# Patient Record
Sex: Female | Born: 1980 | Race: White | Hispanic: No | Marital: Single | Smoking: Former smoker
Health system: Southern US, Community
[De-identification: ages and names within clinical notes are randomized; demographics above are authoritative.]

## PROBLEM LIST (undated history)

## (undated) DIAGNOSIS — B1920 Unspecified viral hepatitis C without hepatic coma: Secondary | ICD-10-CM

## (undated) DIAGNOSIS — F319 Bipolar disorder, unspecified: Secondary | ICD-10-CM

## (undated) DIAGNOSIS — J45909 Unspecified asthma, uncomplicated: Secondary | ICD-10-CM

## (undated) HISTORY — PX: CERVICAL BIOPSY  W/ LOOP ELECTRODE EXCISION: SUR135

## (undated) HISTORY — PX: TUBAL LIGATION: SHX77

## (undated) HISTORY — PX: DILATION AND CURETTAGE OF UTERUS: SHX78

---

## 2009-10-23 ENCOUNTER — Ambulatory Visit: Payer: Self-pay | Admitting: Psychiatry

## 2009-11-02 ENCOUNTER — Other Ambulatory Visit: Payer: Self-pay | Admitting: Emergency Medicine

## 2009-11-03 ENCOUNTER — Ambulatory Visit: Payer: Self-pay | Admitting: Psychiatry

## 2009-11-03 ENCOUNTER — Inpatient Hospital Stay (HOSPITAL_COMMUNITY): Admission: AD | Admit: 2009-11-03 | Discharge: 2009-11-10 | Payer: Self-pay | Admitting: Psychiatry

## 2010-06-02 LAB — BASIC METABOLIC PANEL
CO2: 24 mEq/L (ref 19–32)
Chloride: 107 mEq/L (ref 96–112)
Creatinine, Ser: 0.67 mg/dL (ref 0.4–1.2)
GFR calc Af Amer: >60 mL/min (ref >60)
Potassium: 3.6 mEq/L (ref 3.5–5.1)
Sodium: 138 mEq/L (ref 135–145)

## 2010-06-02 LAB — RAPID URINE DRUG SCREEN, HOSP PERFORMED
Amphetamines: NOT DETECTED
Barbiturates: NOT DETECTED
Benzodiazepines: POSITIVE — AB
Tetrahydrocannabinol: NOT DETECTED

## 2010-06-02 LAB — URINALYSIS, ROUTINE W REFLEX MICROSCOPIC
Glucose, UA: NEGATIVE mg/dL
Hgb urine dipstick: NEGATIVE
Ketones, ur: NEGATIVE mg/dL
Protein, ur: NEGATIVE mg/dL
Urobilinogen, UA: 0.2 mg/dL (ref 0.0–1.0)

## 2010-06-02 LAB — HEPATIC FUNCTION PANEL
ALT: 26 U/L (ref 0–35)
AST: 23 U/L (ref 0–37)
Albumin: 2.6 g/dL — ABNORMAL LOW (ref 3.5–5.2)
Alkaline Phosphatase: 87 U/L (ref 39–117)
Bilirubin, Direct: 0.1 mg/dL (ref 0.0–0.3)
Indirect Bilirubin: 0.3 mg/dL (ref 0.3–0.9)
Total Bilirubin: 0.4 mg/dL (ref 0.3–1.2)
Total Protein: 6.1 g/dL (ref 6.0–8.3)

## 2010-06-02 LAB — DIFFERENTIAL
Eosinophils Relative: 5 % (ref 0–5)
Lymphocytes Relative: 36 % (ref 12–46)
Lymphs Abs: 2.9 10*3/uL (ref 0.7–4.0)
Monocytes Relative: 6 % (ref 3–12)

## 2010-06-02 LAB — CBC
HCT: 41.1 % (ref 36.0–46.0)
Hemoglobin: 14.2 g/dL (ref 12.0–15.0)
MCH: 30.4 pg (ref 26.0–34.0)
MCV: 88.2 fL (ref 78.0–100.0)
Platelets: 280 10*3/uL (ref 150–400)
RBC: 4.66 MIL/uL (ref 3.87–5.11)
WBC: 8.1 10*3/uL (ref 4.0–10.5)

## 2010-06-02 LAB — SYPHILIS: RPR W/REFLEX TO RPR TITER AND TREPONEMAL ANTIBODIES, TRADITIONAL SCREENING AND DIAGNOSIS ALGORITHM: RPR Ser Ql: NONREACTIVE

## 2010-06-02 LAB — URINE MICROSCOPIC-ADD ON

## 2010-06-02 LAB — ETHANOL: Alcohol, Ethyl (B): <5 mg/dL (ref 0–10)

## 2010-06-02 LAB — GC/CHLAMYDIA PROBE AMP, URINE
Chlamydia, Swab/Urine, PCR: NEGATIVE
GC Probe Amp, Urine: NEGATIVE

## 2010-06-02 LAB — PREGNANCY, URINE: Preg Test, Ur: POSITIVE

## 2010-06-02 LAB — TSH: TSH: 0.667 u[IU]/mL (ref 0.350–4.500)

## 2010-06-02 LAB — HIV ANTIBODY (ROUTINE TESTING W REFLEX): HIV: NONREACTIVE

## 2014-11-23 ENCOUNTER — Other Ambulatory Visit (HOSPITAL_COMMUNITY): Payer: Self-pay | Admitting: *Deleted

## 2014-11-23 DIAGNOSIS — N6310 Unspecified lump in the right breast, unspecified quadrant: Secondary | ICD-10-CM

## 2014-11-25 ENCOUNTER — Telehealth: Payer: Self-pay | Admitting: Lab

## 2014-11-25 ENCOUNTER — Encounter (HOSPITAL_COMMUNITY): Payer: Self-pay

## 2014-11-25 ENCOUNTER — Ambulatory Visit (HOSPITAL_COMMUNITY)
Admission: RE | Admit: 2014-11-25 | Discharge: 2014-11-25 | Disposition: A | Discharge status: Home / Self Care | Payer: Self-pay | Source: Ambulatory Visit | Attending: Obstetrics and Gynecology | Admitting: Obstetrics and Gynecology

## 2014-11-25 VITALS — BP 116/70 | Temp 98.2°F | Ht 70.0 in | Wt 225.0 lb

## 2014-11-25 DIAGNOSIS — R922 Inconclusive mammogram: Secondary | ICD-10-CM

## 2014-11-25 HISTORY — DX: Bipolar disorder, unspecified: F31.9

## 2014-11-25 HISTORY — DX: Unspecified asthma, uncomplicated: J45.909

## 2014-11-25 HISTORY — DX: Unspecified viral hepatitis C without hepatic coma: B19.20

## 2014-11-25 NOTE — Progress Notes (Signed)
CLINIC:   [facility] [employer]) Clinic  REASON FOR VISIT: Well-woman exam   HISTORY OF PRESENT ILLNESS:   Ms. Cadogan is a 34 y.o. female who presents to the [facility] today for clinical breast exam. No previous mammogram. Her last pap smear was performed in February 2016 and was negative. Had an abnormal pap in 2005 with LEEP in 2006. Reports three negative paps since this procedure.   REVIEW OF SYSTEMS:   Patient reports lump in right breast that is increasing in size over the past several months. Reports white discharge from both nipples with stimulation. Denies breast pain, skin changes, nipple inversion bilaterally.  Denies any pelvic pain, pressure, or abnormal vaginal bleeding.   ALLERGIES: Allergies  Allergen Reactions  . Latex     MEDICATIONS:  Current outpatient prescriptions:  .  albuterol (PROVENTIL HFA;VENTOLIN HFA) 108 (90 BASE) MCG/ACT inhaler, Inhale into the lungs every 6 (six) hours as needed for wheezing or shortness of breath., Disp: , Rfl:  .  citalopram (CELEXA) 40 MG tablet, Take 40 mg by mouth daily., Disp: , Rfl:  .  ibuprofen (ADVIL,MOTRIN) 800 MG tablet, Take 800 mg by mouth every 8 (eight) hours as needed., Disp: , Rfl:  .  lithium 600 MG capsule, Take 600 mg by mouth 3 (three) times daily with meals.  once a day, Disp: , Rfl:   PHYSICAL EXAM:   BP 116/70 mmHg  Temp(Src) 98.2 F (36.8 C) (Oral)  Ht  (1.778 m)  Wt 225 lb (102.059 kg)  BMI 32.28 kg/m2  LMP 11/24/2014 (Exact Date)  General: Well-nourished, well-appearing female in no acute distress.  She is accompanied by a friend in clinic today.  Stoney Bang, LPN was present during physical exam for this patient.   Breasts: Bilateral breasts exposed and observed with patient standing (arms at side, arms on hips, arms on hips flexed forward, and arms over head).  No gross abnormalities including breast skin puckering or dimpling noted on observation.  Breasts  symmetrical without evidence of skin redness, thickening, or peau d'orange appearance. No nipple retraction or nipple discharge noted bilaterally.  No breast nodularity palpated in the left breast. Right breast with nodularity at the 4 o'clock position. No discrete masses palpated. Axillary lymph nodes: No axillary lymphadenopathy bilaterally.     ASSESSMENT & PLAN:  1. Breast cancer screening: Ms. Trevizo has scattered dense tissue on exam with no palpable breast abnormalities on her clinical breast exam today.  She will receive her diagnostic mammogram as scheduled.  She will be contacted by the imaging center for results of the mammogram. She was given instructions and educational materials regarding breast self-awareness. Ms. Kackley is aware of this plan and agrees with it.   2. Cervical cancer screening: Ms. Lomeli had a normal pap in February 2016. Repeat pap not indicated at this time.     Ms. Bisesi was encouraged to ask questions and all questions were answered to her satisfaction.      Clenton Pare, DNP, AGPCNP-BC, [facility] [phone]

## 2014-11-25 NOTE — Progress Notes (Signed)
Patient ID: Sherry Mcdonald, female   DOB: 1981/01/09, 34 y.o.   MRN: [id number] Smoking cessation information given to patient. Quitline information given to patient along with smoking cessation classes to the cancer center.

## 2014-11-25 NOTE — Telephone Encounter (Signed)
10/18/14-Zoe who is the contact person for this patient stated that patient still doesn't have orange card-Needs some additional paperwork.  She said that she would call me when this was completed. 09/07-Made follow up phone call-Zoe states that a document from IRS is needed to complete orange card process and to give her 2 wks to complete.

## 2014-11-30 ENCOUNTER — Ambulatory Visit
Admission: RE | Admit: 2014-11-30 | Discharge: 2014-11-30 | Disposition: A | Discharge status: Home / Self Care | Payer: No Typology Code available for payment source | Source: Ambulatory Visit | Attending: Obstetrics and Gynecology | Admitting: Obstetrics and Gynecology

## 2014-11-30 DIAGNOSIS — N6310 Unspecified lump in the right breast, unspecified quadrant: Secondary | ICD-10-CM

## 2015-01-26 ENCOUNTER — Other Ambulatory Visit: Payer: Self-pay

## 2015-01-27 NOTE — Telephone Encounter (Signed)
Pt now has orange card-was scheduled for labs on 11/9-no show.  Patient  got lost-Zoe didnt bring -someone else did. Rescheduled for 01/31/2015-Zoe is bringing pt.  She will receive an appmt to see Dr Luciana Axeomer the day she comes in for labs.

## 2015-01-31 ENCOUNTER — Other Ambulatory Visit: Payer: Self-pay

## 2015-01-31 DIAGNOSIS — B182 Chronic viral hepatitis C: Secondary | ICD-10-CM

## 2015-01-31 LAB — HIV ANTIBODY (ROUTINE TESTING W REFLEX): HIV: NONREACTIVE

## 2015-01-31 LAB — HEPATITIS A ANTIBODY, TOTAL: Hep A Total Ab: REACTIVE — AB

## 2015-01-31 LAB — HEPATITIS B CORE ANTIBODY, TOTAL: HEP B C TOTAL AB: NONREACTIVE

## 2015-01-31 LAB — HEPATITIS B SURFACE ANTIGEN: Hepatitis B Surface Ag: NEGATIVE

## 2015-01-31 LAB — HEPATITIS B SURFACE ANTIBODY,QUALITATIVE: Hep B S Ab: NEGATIVE

## 2015-01-31 LAB — IRON: IRON: 65 ug/dL (ref 40–190)

## 2015-02-01 LAB — ANTI-NUCLEAR AB-TITER (ANA TITER)

## 2015-02-01 LAB — PROTIME-INR
INR: 0.89 (ref <1.50)
PROTHROMBIN TIME: 12.1 s (ref 11.6–15.2)

## 2015-02-01 LAB — ANA: ANA: POSITIVE — AB

## 2015-02-01 NOTE — Telephone Encounter (Signed)
Pt and Zoey came in for blood work 01/31/15 and was given an appmt to see Dr. Luciana Axeomer on 02/16/2015 at 325 pm

## 2015-02-02 NOTE — Addendum Note (Signed)
Encounter addended by: Priscille Heidelberghristine P Esa Raden, RN on: 02/02/2015  4:39 PM<BR>     Documentation filed: Charges VN

## 2015-02-06 LAB — HEPATITIS C RNA QUANTITATIVE
HCV QUANT LOG: 6.12 {Log} — AB (ref <1.18)
HCV Quantitative: 1321038 IU/mL — ABNORMAL HIGH (ref <15)

## 2015-02-08 LAB — HEPATITIS C GENOTYPE

## 2015-02-16 ENCOUNTER — Encounter: Payer: Self-pay | Admitting: Internal Medicine

## 2015-02-16 ENCOUNTER — Ambulatory Visit (INDEPENDENT_AMBULATORY_CARE_PROVIDER_SITE_OTHER): Payer: No Typology Code available for payment source | Admitting: Internal Medicine

## 2015-02-16 VITALS — BP 126/82 | HR 79 | Temp 98.5°F | Ht 70.0 in | Wt 223.0 lb

## 2015-02-16 DIAGNOSIS — B182 Chronic viral hepatitis C: Secondary | ICD-10-CM | POA: Insufficient documentation

## 2015-02-16 DIAGNOSIS — Z23 Encounter for immunization: Secondary | ICD-10-CM

## 2015-02-16 MED ORDER — ELBASVIR-GRAZOPREVIR 50-100 MG PO TABS: 1.0000 | ORAL_TABLET | Freq: Every day | ORAL | Status: DC

## 2015-02-16 NOTE — Addendum Note (Signed)
Addended by: Gardiner BarefootOMER, Fayola Meckes W on: 02/16/2015 04:25 PM   Modules accepted: Orders

## 2015-02-16 NOTE — Progress Notes (Signed)
[facility]   CC: consideration for treatment for chronic hepatitis C  HPI:  +Sherry Mcdonald is a 34 y.o. female who presents for initial evaluation and management of chronic hepatitis C.  Patient tested positive tested in 2007. 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 Patient denies multiple sexual partners, renal dialysis, sexual contact with person with liver disease. Patient has had other studies performed. Results: hepatitis C RNA by PCR, result: positive. Patient has not had prior treatment for Hepatitis C. Patient does not have a past history of liver disease. Patient does not have a family history of liver disease. Patient does not  have associated signs or symptoms related to liver disease.  Labs reviewed and confirm chronic hepatitis C with a positive viral load.    Has remained drug free.      Patient does have documented immunity to Hepatitis A. Patient does not have documented immunity to Hepatitis B.    Review of Systems:  Constitutional: negative for fatigue and malaise Gastrointestinal: negative for diarrhea and constipation Musculoskeletal: negative for myalgias and arthralgias All other systems reviewed and are negative      Past Medical History  Diagnosis Date  . Asthma   . Bipolar 1 disorder (HCC)   . Hepatitis C     Prior to Admission medications   Medication Sig Start Date End Date Taking? Authorizing Provider  albuterol (PROVENTIL HFA;VENTOLIN HFA) 108 (90 BASE) MCG/ACT inhaler Inhale into the lungs every 6 (six) hours as needed for wheezing or shortness of breath.   Yes Historical Provider, MD  citalopram (CELEXA) 40 MG tablet Take 40 mg by mouth daily.   Yes Historical Provider, MD  lithium 600 MG capsule Take 600 mg by mouth 3 (three) times daily with meals.  once a day   Yes Historical Provider, MD  meloxicam (MOBIC) 15 MG tablet Take 15 mg by mouth daily.   Yes Historical  Provider, MD    Allergies  Allergen Reactions  . Latex     Social History  Substance Use Topics  . Smoking status: Current Every Day Smoker    Types: Cigarettes    Start date: 03/19/2009  . Smokeless tobacco: Never Used  . Alcohol Use: No    Family History  Problem Relation Age of Onset  . Breast cancer Maternal Grandmother   . Breast cancer Paternal Grandmother   . Diabetes Paternal Grandmother   No liver cancer, no cirrhosis   Objective:  Constitutional: in no apparent distress and alert,  Filed Vitals:   02/16/15 1532  BP: 126/82  Pulse: 79  Temp: 98.5 F (36.9 C)   Eyes: anicteric Cardiovascular: Cor RRR and No murmurs Respiratory: CTA B; normal respiratory effort Gastrointestinal: Bowel sounds are normal, liver is not enlarged, spleen is not enlarged Musculoskeletal: peripheral pulses normal, no pedal edema, no clubbing or cyanosis Skin: negative for - jaundice, spider hemangioma, telangiectasia, palmar erythema, ecchymosis and atrophy; no porphyria cutanea tarda Lymphatic: no cervical lymphadenopathy   Laboratory Genotype:  Lab Results  Component Value Date   HCVGENOTYPE 1a 01/31/2015   HCV viral load:  Lab Results  Component Value Date   HCVQUANT [id number]* 01/31/2015   Lab Results  Component Value Date   WBC 8.1 11/02/2009   HGB 14.2 11/02/2009   HCT 41.1 11/02/2009   MCV 88.2 11/02/2009   PLT 280 11/02/2009    Lab Results  Component Value Date   CREATININE 0.67  11/02/2009   BUN 6 11/02/2009   NA 138 11/02/2009   K 3.6 11/02/2009   CL 107 11/02/2009   CO2 24 11/02/2009    Lab Results  Component Value Date   ALT 26 11/04/2009   AST 23 11/04/2009   ALKPHOS 87 11/04/2009     Labs and history reviewed and show CHILD-PUGH A  5-6 points: Child class A 7-9 points: Child class B 10-15 points: Child class C  Lab Results  Component Value Date   INR 0.89 01/31/2015   BILITOT 0.4 11/04/2009   ALBUMIN 2.6* 11/04/2009     Assessment:  New Patient with Chronic Hepatitis C genotype 1a, untreated.  I discussed with the patient the lab findings that confirm chronic hepatitis C as well as the natural history and progression of disease including about 30% of people who develop cirrhosis of the liver if left untreated and once cirrhosis is established there is a 2-7% risk per year of liver cancer and liver failure.  I discussed the importance of treatment and benefits in reducing the risk, even if significant liver fibrosis exists.   Plan: 1) Patient counseled extensively on limiting acetaminophen to no more than 2 grams daily, avoidance of alcohol. 2) Transmission discussed with patient including sexual transmission, sharing razors and toothbrush.   3) Will need referral to gastroenterology if concern for cirrhosis 4) Will need referral for substance abuse counseling: No.; Further work up to include urine drug screen  No. 5) Will prescribe Zepatier for 12 weeks or 16 weeks with ribavirin if any NS5A resistance found 6) Hepatitis A vaccine No. 7) Hepatitis B vaccine Yes.   8) Pneumovax vaccine if concern for cirrhosis 9) Further work up to include liver staging with elastography 10) NS5A test  Yes.   10) will follow up after elastography

## 2015-02-16 NOTE — Addendum Note (Signed)
Addended by: Wendall MolaOCKERHAM, Kaeleb Emond A on: 02/16/2015 04:54 PM   Modules accepted: Orders

## 2015-02-16 NOTE — Patient Instructions (Signed)
Date 02/16/2015  Dear Ms Valentina LucksStout, As discussed in the ID Clinic, your hepatitis C therapy will include the following medication:          ZEPATIER (elbasvir/grazoprevir)   Please note that the medications are for a total of 12 weeks or 16 weeks. ---------------------------------------------------------------- Your HCV Treatment Start Date: TBA   Your HCV genotype:  1a    Liver Fibrosis: TBD    ---------------------------------------------------------------- YOUR PHARMACY CONTACT:   [facility] [address] Phone: [phone] Hours: Monday to Friday 7:30 am to 6:00 pm   Please always contact your pharmacy at least 3-4 business days before you run out of medications to ensure your next month's medication is ready or 1 week prior to running out if you receive it by mail.  Remember, each prescription is for 28 days. ---------------------------------------------------------------- GENERAL NOTES REGARDING YOUR HEPATITIS C MEDICATION:  Zepatier (elbasvir/grazoprevir): -can be taken with or without food -The tablets are yellow -The tablets should be stored at room temperature   - The common side effects associated with Zepatier include:      1. Fatigue      2. Headache      3. Nausea      4. Diarrhea      5. Insomnia

## 2015-02-20 LAB — HCV RNA NS5A DRUG RESISTANCE

## 2015-02-21 ENCOUNTER — Telehealth: Payer: Self-pay | Admitting: *Deleted

## 2015-02-21 NOTE — Telephone Encounter (Signed)
Patient is asking if her lab results are in and if a determination has been made regarding her Hep C therapy.  She is to follow up with Dr. Luciana Axeomer 1/9, but was looking for results sooner. She is at a temporary number 12/5 and 12/6. Andree CossHowell, Erinn Mendosa M, RN

## 2015-02-21 NOTE — Telephone Encounter (Signed)
The NS5A test is still pending and once it is done, Sherry Mcdonald will send in the info to [facility].

## 2015-03-01 ENCOUNTER — Encounter: Payer: Self-pay | Admitting: Pharmacy Technician

## 2015-03-03 ENCOUNTER — Telehealth: Payer: Self-pay | Admitting: Pharmacist Clinician (PhC)/ Clinical Pharmacy Specialist

## 2015-03-03 NOTE — Telephone Encounter (Signed)
Sherry Mcdonald just started on zepatier a couple days ago. She is on zepatier without riba. She complained of just feeling tired and some diarrhea. I told that it could be due to early side effects with the drug and to get some OTC loperamide for PRN diarrhea. Stressed the importance of not missing any meds. She is going to try that and if still has diarrhea issue, we can do lomotil if needed.

## 2015-03-17 ENCOUNTER — Ambulatory Visit (HOSPITAL_COMMUNITY): Admission: RE | Admit: 2015-03-17 | Payer: No Typology Code available for payment source | Source: Ambulatory Visit

## 2015-03-22 ENCOUNTER — Telehealth: Payer: Self-pay | Admitting: *Deleted

## 2015-03-22 NOTE — Telephone Encounter (Signed)
Ok, if she doesn't feel she can take it we can try something else later, maybe have her come back in 4-6 months and can see then if some resistance developed. thanks

## 2015-03-22 NOTE — Telephone Encounter (Signed)
No, not really.  Minh?

## 2015-03-22 NOTE — Telephone Encounter (Signed)
Patient feels the Zepatier is "wiping out her bipolar medications" and cannot function. She is experiencing a depressive episode. She has had uncontrolled episodes of sobbing for the last 2.5 weeks, has had suicidal thoughts.  She is taking Lithium, celexa and is scheduled for [facility], appointment 1/[facility], for adjustment of her mental health medication. She missed her elastography due to this depression.  She has stopped the zepatier 1/1, is aware of the consequences, that this is a one-time only shot.  The suicidal thoughts and sobbing has alleviated since stopping the zepatier.  She is not sure if she wants to restart Zepatier after her mental health meds are adjusted, does not even know if it is an option.  [facility], is staying with a friend Pam temporarily - awaiting address approval from her officer.  [place].  Per patient, it is ok to call her at her friend's phone numbers.  Andree CossHowell, Tarry Fountain M, RN

## 2015-03-22 NOTE — Telephone Encounter (Signed)
Just run the interaction check again. No issue AT ALL with celexa or lithium with zepatier. I think she called me before too.

## 2015-03-24 NOTE — Telephone Encounter (Signed)
Patient had questions about restarting her Zepatier, asking if she should call for a refill when she has 10 left.  RN explained that she should NOT do this, as she has already stopped the medication (per her previous report, she stopped it 1/1). Patient stated that she "thought about restarting it today because when she took it yesterday things seemed ok." RN again advised against this, as per Dr. Ephriam Knucklesomer's instructions this was putting her at risk for resistance.  RN cancelled patient's appointment 1/9, confirmed return appointment 6/28. RN offered to have patient drop off what is left [facility] so we could dispose of it, rather than her choosing to restart treatment on her own.  Patient will consider this.

## 2015-03-24 NOTE — Telephone Encounter (Signed)
Scheduled her 6/28.

## 2015-03-27 ENCOUNTER — Emergency Department (HOSPITAL_BASED_OUTPATIENT_CLINIC_OR_DEPARTMENT_OTHER): Payer: No Typology Code available for payment source

## 2015-03-27 ENCOUNTER — Encounter (HOSPITAL_BASED_OUTPATIENT_CLINIC_OR_DEPARTMENT_OTHER): Payer: Self-pay | Admitting: Emergency Medicine

## 2015-03-27 ENCOUNTER — Emergency Department (HOSPITAL_BASED_OUTPATIENT_CLINIC_OR_DEPARTMENT_OTHER)
Admission: EM | Admit: 2015-03-27 | Discharge: 2015-03-27 | Disposition: A | Discharge status: Home / Self Care | Payer: No Typology Code available for payment source | Attending: Emergency Medicine | Admitting: Emergency Medicine

## 2015-03-27 DIAGNOSIS — Z79899 Other long term (current) drug therapy: Secondary | ICD-10-CM | POA: Insufficient documentation

## 2015-03-27 DIAGNOSIS — J45901 Unspecified asthma with (acute) exacerbation: Secondary | ICD-10-CM

## 2015-03-27 DIAGNOSIS — F17210 Nicotine dependence, cigarettes, uncomplicated: Secondary | ICD-10-CM | POA: Insufficient documentation

## 2015-03-27 DIAGNOSIS — M7711 Lateral epicondylitis, right elbow: Secondary | ICD-10-CM

## 2015-03-27 DIAGNOSIS — Z8619 Personal history of other infectious and parasitic diseases: Secondary | ICD-10-CM | POA: Insufficient documentation

## 2015-03-27 DIAGNOSIS — F319 Bipolar disorder, unspecified: Secondary | ICD-10-CM | POA: Insufficient documentation

## 2015-03-27 DIAGNOSIS — Z91040 Latex allergy status: Secondary | ICD-10-CM | POA: Insufficient documentation

## 2015-03-27 MED ORDER — PREDNISONE 20 MG PO TABS
40.0000 mg | ORAL_TABLET | Freq: Once | ORAL | Status: AC
  Administered 2015-03-27: 40 mg via ORAL
  Filled 2015-03-27: qty 2

## 2015-03-27 MED ORDER — PREDNISONE 20 MG PO TABS: ORAL_TABLET | ORAL | Status: DC

## 2015-03-27 MED ORDER — OXYCODONE HCL 5 MG PO TABS
5.0000 mg | ORAL_TABLET | Freq: Once | ORAL | Status: AC
  Administered 2015-03-27: 5 mg via ORAL
  Filled 2015-03-27: qty 1

## 2015-03-27 MED ORDER — ALBUTEROL SULFATE (2.5 MG/3ML) 0.083% IN NEBU
INHALATION_SOLUTION | RESPIRATORY_TRACT | Status: AC
  Administered 2015-03-27: 2.5 mg
  Filled 2015-03-27: qty 3

## 2015-03-27 MED ORDER — AEROCHAMBER PLUS W/MASK MISC
1.0000 | Freq: Once | Status: DC
  Filled 2015-03-27: qty 1

## 2015-03-27 MED ORDER — IPRATROPIUM-ALBUTEROL 0.5-2.5 (3) MG/3ML IN SOLN
RESPIRATORY_TRACT | Status: AC
  Administered 2015-03-27: 3 mL
  Filled 2015-03-27: qty 3

## 2015-03-27 MED ORDER — ALBUTEROL SULFATE HFA 108 (90 BASE) MCG/ACT IN AERS
2.0000 | INHALATION_SPRAY | Freq: Once | RESPIRATORY_TRACT | Status: AC
  Administered 2015-03-27: 2 via RESPIRATORY_TRACT
  Filled 2015-03-27: qty 6.7

## 2015-03-27 NOTE — ED Notes (Signed)
Per pt report is out of inhaler for 3 weeks. Next complaint is that last week was at work moving furniture when felt sharp pain to rt elbow that has numbness sharp pain at times in last two finger of rt hand. Today assisted roommate with car in ditch and increase pain to rt elbow. Took tylenol for pain with no relief.

## 2015-03-27 NOTE — ED Provider Notes (Signed)
CSN: [id number]     Arrival date & time 03/27/15  1441 History   First MD Initiated Contact with Patient 03/27/15 1712     Chief Complaint  Patient presents with  . Asthma  . Arm Pain     (Consider location/radiation/quality/duration/timing/severity/associated sxs/prior Treatment) Patient is a 35 y.o. female presenting with asthma and arm pain.  Asthma This is a recurrent problem. The current episode started more than 1 week ago. The problem occurs constantly. The problem has not changed since onset.Pertinent negatives include no chest pain, no headaches and no shortness of breath. Nothing aggravates the symptoms. Nothing relieves the symptoms. She has tried nothing for the symptoms. The treatment provided no relief.  Arm Pain This is a new problem. The current episode started more than 1 week ago. The problem occurs constantly. The problem has been gradually worsening. Pertinent negatives include no chest pain, no headaches and no shortness of breath. The symptoms are aggravated by twisting.    35 yo F with a chief complaints of right elbow pain. This is been going on for the past week or so. Worse with movement palpation. Localized to the lateral aspect of the elbow. Denies injury. Patient denies fevers or chills. Patient is also run out of her inhaler. Feels like her asthma has gotten mildly worse. States that she is unable take NSAIDs secondary to her lithium. Has been taking Tylenol without relief.  Past Medical History  Diagnosis Date  . Asthma   . Bipolar 1 disorder (HCC)   . Hepatitis C    Past Surgical History  Procedure Laterality Date  . Tubal ligation    . Cervical biopsy  w/ loop electrode excision    . Dilation and curettage of uterus     Family History  Problem Relation Age of Onset  . Breast cancer Maternal Grandmother   . Breast cancer Paternal Grandmother   . Diabetes Paternal Grandmother    Social History  Substance Use Topics  . Smoking status: Current Every  Day Smoker    Types: Cigarettes    Start date: 03/19/2009  . Smokeless tobacco: Never Used  . Alcohol Use: No   OB History    Gravida Para Term Preterm AB TAB SAB Ectopic Multiple Living   7 3 3  4  4   3      Review of Systems  Constitutional: Negative for fever and chills.  HENT: Negative for congestion and rhinorrhea.   Eyes: Negative for redness and visual disturbance.  Respiratory: Positive for wheezing. Negative for shortness of breath.   Cardiovascular: Negative for chest pain and palpitations.  Gastrointestinal: Negative for nausea and vomiting.  Genitourinary: Negative for dysuria and urgency.  Musculoskeletal: Positive for myalgias and arthralgias.  Skin: Negative for pallor and wound.  Neurological: Negative for dizziness and headaches.      Allergies  Latex  Home Medications   Prior to Admission medications   Medication Sig Start Date End Date Taking? Authorizing Provider  albuterol (PROVENTIL HFA;VENTOLIN HFA) 108 (90 BASE) MCG/ACT inhaler Inhale into the lungs every 6 (six) hours as needed for wheezing or shortness of breath.    Historical Provider, MD  citalopram (CELEXA) 40 MG tablet Take 40 mg by mouth daily.    Historical Provider, MD  Elbasvir-Grazoprevir (ZEPATIER) 50-100 MG TABS Take 1 tablet by mouth daily. 02/16/15   Gardiner Barefoot, MD  lithium 600 MG capsule Take 600 mg by mouth 3 (three) times daily with meals. 300mg  once a day  Historical Provider, MD  meloxicam (MOBIC) 15 MG tablet Take 15 mg by mouth daily.    Historical Provider, MD  predniSONE (DELTASONE) 20 MG tablet 2 tabs po daily x 4 days 03/27/15   Melene Planan Korben Carcione, DO   BP 127/81 mmHg  Pulse 76  Temp(Src) 98.1 F (36.7 C) (Oral)  Resp 18  Ht 5\' 10"  (1.778 m)  Wt 221 lb (100.245 kg)  BMI 31.71 kg/m2  SpO2 100%  LMP 03/04/2015 (Within Weeks) Physical Exam  Constitutional: She is oriented to person, place, and time. She appears well-developed and well-nourished. No distress.  HENT:  Head:  Normocephalic and atraumatic.  Eyes: EOM are normal. Pupils are equal, round, and reactive to light.  Neck: Normal range of motion. Neck supple.  Cardiovascular: Normal rate and regular rhythm.  Exam reveals no gallop and no friction rub.   No murmur heard. Pulmonary/Chest: Effort normal. She has no wheezes. She has no rales.  Abdominal: Soft. She exhibits no distension. There is no tenderness.  Musculoskeletal: She exhibits tenderness. She exhibits no edema.  Tender palpation to the lateral epicondyle of the right elbow. Pain with internal and external rotation. Pulse motor and sensation intact distally.  Neurological: She is alert and oriented to person, place, and time.  Skin: Skin is warm and dry. She is not diaphoretic.  Psychiatric: She has a normal mood and affect. Her behavior is normal.  Nursing note and vitals reviewed.   ED Course  Procedures (including critical care time) Labs Review Labs Reviewed - No data to display  Imaging Review Dg Elbow Complete Right  03/27/2015  CLINICAL DATA:  Elbow pain for 1 and half weeks. EXAM: RIGHT ELBOW - COMPLETE 3+ VIEW COMPARISON:  None. FINDINGS: There is no evidence of fracture, dislocation, or joint effusion. There is no evidence of arthropathy or other focal bone abnormality. Soft tissues are unremarkable. IMPRESSION: Negative. Electronically Signed   By: Signa Kellaylor  Stroud M.D.   On: 03/27/2015 16:11   I have personally reviewed and evaluated these images and lab results as part of my medical decision-making.   EKG Interpretation None      MDM   Final diagnoses:  Tennis elbow syndrome, right  Asthma attack    35 yo F with a chief complaint of right elbow pain. Clinically with tennis elbow. Discussed risks and benefits of NSAIDs. Patient will discuss with her psychiatrist. Will start on steroids for her asthma flare. Given inhaler with spacer. PCP follow-up.   I have discussed the diagnosis/risks/treatment options with the patient  and believe the pt to be eligible for discharge home to follow-up with PCP. We also discussed returning to the ED immediately if new or worsening sx occur. We discussed the sx which are most concerning (e.g., sudden worsening pain, fever, inability to tolerate by mouth) that necessitate immediate return. Medications administered to the patient during their visit and any new prescriptions provided to the patient are listed below.  Medications given during this visit Medications  albuterol (PROVENTIL) (2.5 MG/3ML) 0.083% nebulizer solution (2.5 mg  Given 03/27/15 1504)  ipratropium-albuterol (DUONEB) 0.5-2.5 (3) MG/3ML nebulizer solution (3 mLs  Given 03/27/15 1504)  predniSONE (DELTASONE) tablet 40 mg (40 mg Oral Given 03/27/15 1939)  oxyCODONE (Oxy IR/ROXICODONE) immediate release tablet 5 mg (5 mg Oral Given 03/27/15 1939)  albuterol (PROVENTIL HFA;VENTOLIN HFA) 108 (90 Base) MCG/ACT inhaler 2 puff (2 puffs Inhalation Given 03/27/15 1944)    Discharge Medication List as of 03/27/2015  7:05 PM  START taking these medications   Details  predniSONE (DELTASONE) 20 MG tablet 2 tabs po daily x 4 days, Print        The patient appears reasonably screen and/or stabilized for discharge and I doubt any other medical condition or other [facility] requiring further screening, evaluation, or treatment in the ED at this time prior to discharge.      Melene Plan, DO 03/27/15 2343

## 2015-03-27 NOTE — Discharge Instructions (Signed)
Talk to your psychiatrist about NSIADS.  If able take 4 over the counter ibuprofen tablets 3 times a day or 2 over-the-counter naproxen tablets twice a day for pain.  Tennis Elbow Tennis elbow (lateral epicondylitis) is inflammation of the outer tendons of your forearm close to your elbow. Your tendons attach your muscles to your bones. The outer tendons of your forearm are used to extend your wrist, and they attach on the outside part of your elbow. Tennis elbow is often found in people who play tennis, but anyone may get the condition from repeatedly extending the wrist or turning the forearm. CAUSES This condition is caused by repeatedly extending your wrist and using your hands. It can result from sports or work that requires repetitive forearm movements. Tennis elbow may also be caused by an injury. RISK FACTORS You have a higher risk of developing tennis elbow if you play tennis or another racquet sport. You also have a higher risk if you frequently use your hands for work. This condition is also more likely to develop in:  Musicians.  Carpenters, painters, and plumbers.  Cooks.  Cashiers.  People who work in [employer].  Holiday representativeConstruction workers.  Butchers.  People who use computers. SYMPTOMS Symptoms of this condition include:  Pain and tenderness in your forearm and the outer part of your elbow. You may only feel the pain when you use your arm, or you may feel it even when you are not using your arm.  A burning feeling that runs from your elbow through your arm.  Weak grip in your hands. DIAGNOSIS  This condition may be diagnosed by medical history and physical exam. You may also have other tests, including:  X-rays.  MRI. TREATMENT Your health care provider will recommend lifestyle adjustments, such as resting and icing your arm. Treatment may also include:  Medicines for inflammation. This may include shots of cortisone if your pain continues.  Physical therapy. This  may include massage or exercises.  An elbow brace. Surgery may eventually be recommended if your pain does not go away with treatment. HOME CARE INSTRUCTIONS Activity  Rest your elbow and wrist as directed by your health care provider. Try to avoid any activities that caused the problem until your health care provider says that you can do them again.  If a physical therapist teaches you exercises, do all of them as directed.  If you lift an object, lift it with your palm facing upward. This lowers the stress on your elbow. Lifestyle  If your tennis elbow is caused by sports, check your equipment and make sure that:  You are using it correctly.  It is the best fit for you.  If your tennis elbow is caused by work, take breaks frequently, if you are able. Talk with your manager about how to best perform tasks in a way that is safe.  If your tennis elbow is caused by computer use, talk with your manager about any changes that can be made to your work environment. General Instructions  If directed, apply ice to the painful area:  Put ice in a plastic bag.  Place a towel between your skin and the bag.  Leave the ice on for 20 minutes, 2-3 times per day.  Take medicines only as directed by your health care provider.  If you were given a brace, wear it as directed by your health care provider.  Keep all follow-up visits as directed by your health care provider. This is important. SEEK  MEDICAL CARE IF:  Your pain does not get better with treatment.  Your pain gets worse.  You have numbness or weakness in your forearm, hand, or fingers.   This information is not intended to replace advice given to you by your health care provider. Make sure you discuss any questions you have with your health care provider.   Document Released: 03/05/2005 Document Revised: 07/20/2014 Document Reviewed: 03/01/2014 Elsevier Interactive Patient Education [employer]

## 2015-03-27 NOTE — ED Notes (Signed)
Pt in c/o asthma related sx onset several days, states she is out of her asthma meds. Also c/o R arm pain after pushing car in snow this weekend. Speaking in complete sentences, breathing even and unlabored.

## 2015-03-27 NOTE — ED Notes (Signed)
RT in to assess at this time.

## 2015-03-28 ENCOUNTER — Ambulatory Visit: Payer: No Typology Code available for payment source | Admitting: Internal Medicine

## 2015-04-06 ENCOUNTER — Telehealth: Payer: Self-pay | Admitting: Pharmacy Technician

## 2015-04-29 ENCOUNTER — Encounter (HOSPITAL_COMMUNITY): Payer: Self-pay

## 2015-04-29 ENCOUNTER — Emergency Department (INDEPENDENT_AMBULATORY_CARE_PROVIDER_SITE_OTHER)
Admission: EM | Admit: 2015-04-29 | Discharge: 2015-04-29 | Disposition: A | Discharge status: Home / Self Care | Payer: No Typology Code available for payment source | Source: Home / Self Care | Attending: Emergency Medicine | Admitting: Emergency Medicine

## 2015-04-29 DIAGNOSIS — M545 Low back pain, unspecified: Secondary | ICD-10-CM

## 2015-04-29 DIAGNOSIS — M7711 Lateral epicondylitis, right elbow: Secondary | ICD-10-CM

## 2015-04-29 LAB — POCT URINALYSIS DIP (DEVICE)
BILIRUBIN URINE: NEGATIVE
Glucose, UA: NEGATIVE mg/dL
HGB URINE DIPSTICK: NEGATIVE
KETONES UR: NEGATIVE mg/dL
NITRITE: NEGATIVE
Protein, ur: NEGATIVE mg/dL
Specific Gravity, Urine: <=1.005 (ref 1.005–1.030)
Urobilinogen, UA: 0.2 mg/dL (ref 0.0–1.0)
pH: 5.5 (ref 5.0–8.0)

## 2015-04-29 MED ORDER — PREDNISONE 50 MG PO TABS
ORAL_TABLET | ORAL | Status: DC
Start: 2015-04-29 — End: 2015-06-02

## 2015-04-29 MED ORDER — KETOROLAC TROMETHAMINE 60 MG/2ML IM SOLN
60.0000 mg | Freq: Once | INTRAMUSCULAR | Status: AC
  Administered 2015-04-29: 60 mg via INTRAMUSCULAR

## 2015-04-29 MED ORDER — KETOROLAC TROMETHAMINE 60 MG/2ML IM SOLN
INTRAMUSCULAR | Status: AC
  Filled 2015-04-29: qty 2

## 2015-04-29 MED ORDER — METHYLPREDNISOLONE ACETATE 80 MG/ML IJ SUSP
80.0000 mg | Freq: Once | INTRAMUSCULAR | Status: AC
  Administered 2015-04-29: 80 mg via INTRAMUSCULAR

## 2015-04-29 MED ORDER — HYDROCODONE-ACETAMINOPHEN 5-325 MG PO TABS: 1.0000 | ORAL_TABLET | Freq: Four times a day (QID) | ORAL | Status: DC | PRN

## 2015-04-29 MED ORDER — METHYLPREDNISOLONE ACETATE 80 MG/ML IJ SUSP
INTRAMUSCULAR | Status: AC
  Filled 2015-04-29: qty 1

## 2015-04-29 NOTE — ED Provider Notes (Signed)
CSN: [id number]     Arrival date & time 04/29/15  1302 History   None    Chief Complaint  Patient presents with  . Back Pain  . Shoulder Pain   (Consider location/radiation/quality/duration/timing/severity/associated sxs/prior Treatment) HPI  She is a 35 year old woman here for evaluation of back pain and right elbow pain.  She has had the elbow pain for several weeks. It typically responds pretty well to Tylenol and ibuprofen. The pain is located on the lateral epicondyles and will sometimes shoot down her arm. She works as a Facilities manager. She also helped a friend move yesterday which involves a lot of heavy lifting.  She reports pain in her middle low back. This started yesterday after helping her friend move. She specifically identifies moving heavy cinder blocks as trigger for the pain. It is worse with flexion and extension. She is able to rotate and lateral bend without too much discomfort. No radiating pain. No numbness, tubing, weakness. No bowel or bladder incontinence. She has tried Tylenol, ibuprofen, and heat without improvement.   Past Medical History  Diagnosis Date  . Asthma   . Bipolar 1 disorder (HCC)   . Hepatitis C    Past Surgical History  Procedure Laterality Date  . Tubal ligation    . Cervical biopsy  w/ loop electrode excision    . Dilation and curettage of uterus     Family History  Problem Relation Age of Onset  . Breast cancer Maternal Grandmother   . Breast cancer Paternal Grandmother   . Diabetes Paternal Grandmother    Social History  Substance Use Topics  . Smoking status: Current Every Day Smoker    Types: Cigarettes    Start date: 03/19/2009  . Smokeless tobacco: Never Used  . Alcohol Use: No   OB History    Gravida Para Term Preterm AB TAB SAB Ectopic Multiple Living   Review of Systems As in history of present illness Allergies  Latex  Home Medications   Prior to Admission medications   Medication Sig  Start Date End Date Taking? Authorizing Provider  albuterol (PROVENTIL HFA;VENTOLIN HFA) 108 (90 BASE) MCG/ACT inhaler Inhale into the lungs every 6 (six) hours as needed for wheezing or shortness of breath.    Historical Provider, MD  citalopram (CELEXA) 40 MG tablet Take 40 mg by mouth daily.    Historical Provider, MD  Elbasvir-Grazoprevir (ZEPATIER) 50-100 MG TABS Take 1 tablet by mouth daily. 02/16/15   Gardiner Barefoot, MD  HYDROcodone-acetaminophen (NORCO) 5-325 MG tablet Take 1 tablet by mouth every 6 (six) hours as needed for moderate pain. 04/29/15   Charm Rings, MD  lithium 600 MG capsule Take 600 mg by mouth 3 (three) times daily with meals.  once a day    Historical Provider, MD  meloxicam (MOBIC) 15 MG tablet Take 15 mg by mouth daily.    Historical Provider, MD  predniSONE (DELTASONE) 50 MG tablet Take 1 pill daily for 4 days starting tomorrow. 04/29/15   Charm Rings, MD   Meds Ordered and Administered this Visit   Medications  ketorolac (TORADOL) injection 60 mg (not administered)  methylPREDNISolone acetate (DEPO-MEDROL) injection 80 mg (not administered)    BP 131/79 mmHg  Pulse 78  Temp(Src) 97.5 F (36.4 C) (Oral)  Resp 18  SpO2 100% No data found.   Physical Exam  Constitutional: She is oriented to person, place,  and time. She appears well-developed and well-nourished.  Cardiovascular: Normal rate.   Musculoskeletal:  Right elbow: No erythema or edema. She is exquisitely tender over the lateral epicondyles. No weakness. Full range of motion. 2+ radial pulse. Back: No erythema or edema. No vertebral tenderness or step-offs. She is tender over the mid lower back. Negative straight leg raise bilaterally. 5 out of 5 strength bilaterally.  Neurological: She is alert and oriented to person, place, and time.    ED Course  Procedures (including critical care time)  Labs Review Labs Reviewed  POCT URINALYSIS DIP (DEVICE) - Abnormal; Notable for the following:     Leukocytes, UA TRACE (*)    All other components within normal limits    Imaging Review No results found.   MDM   1. Midline low back pain without sciatica   2. Right tennis elbow    I suspect her back pain is due to poor ergonomics when helping her friend move. We'll treat with Toradol and Depo-Medrol injections here. Prescription given for 4 additional days of prednisone. I did provide a prescription for hydrocodone to be used as needed for pain. I recommended she purchase a tennis elbow brace. I provided a printout of what this looks like. We also discussed ice for the elbow and ice/heat for the back. Work note provided for today and tomorrow. Follow-up as needed.    Charm Rings, MD 04/29/15 1344

## 2015-04-29 NOTE — Discharge Instructions (Signed)
Your back pain is coming from overdoing it yesterday. Put ice on your back for 20 minutes, followed by heat for 20 minutes. Do this as often as you can. We gave you some medicine here to help with the pain. Take prednisone daily for 4 days, starting tomorrow. Use the hydrocodone every 4-6 hours as needed for severe pain. Do not drive while taking this medicine.  The elbow pain is from tennis elbow. The medicines for your back will also help with this. I printed a picture of an elbow brace that I would like you to get. You can find these at any drugstore or [facility]. Medical supply stores should also carry them.  Follow-up with your primary care doctor if things are not improving over the next week.

## 2015-04-29 NOTE — ED Notes (Signed)
Pt stated that she was DX with tennis elbow and DDD and that her pain has gotten worse. Pt said she was walking a dog and it pulled her and caused more pain. Pt alert and oriented.

## 2015-05-03 NOTE — Telephone Encounter (Signed)
Sherry Mcdonald called to say that Sherry Mcdonald only had the first month of Zepatier.  She did not refill the next 2 months.  She started the med on 02/28/15.

## 2015-05-11 ENCOUNTER — Emergency Department (INDEPENDENT_AMBULATORY_CARE_PROVIDER_SITE_OTHER)
Admission: EM | Admit: 2015-05-11 | Discharge: 2015-05-11 | Disposition: A | Discharge status: Home / Self Care | Payer: No Typology Code available for payment source | Source: Home / Self Care | Attending: Family Medicine | Admitting: Family Medicine

## 2015-05-11 ENCOUNTER — Encounter (HOSPITAL_COMMUNITY): Payer: Self-pay | Admitting: Emergency Medicine

## 2015-05-11 ENCOUNTER — Emergency Department (INDEPENDENT_AMBULATORY_CARE_PROVIDER_SITE_OTHER): Payer: No Typology Code available for payment source

## 2015-05-11 DIAGNOSIS — M545 Low back pain: Secondary | ICD-10-CM

## 2015-05-11 MED ORDER — CYCLOBENZAPRINE HCL 10 MG PO TABS: 10.0000 mg | ORAL_TABLET | Freq: Two times a day (BID) | ORAL | Status: DC | PRN

## 2015-05-11 NOTE — ED Notes (Signed)
Patient complains of lower back pain.  Patient reports a history of back issues.  Patient does not have insurance, [facility]  Reports this site is unable to make referrals.  Patient here today for back pain and right elbow.  Both are chronic issues.

## 2015-05-11 NOTE — Discharge Instructions (Signed)
Back Pain, Adult Back pain is very common in adults.The cause of back pain is rarely dangerous and the pain often gets better over time.The cause of your back pain may not be known. Some common causes of back pain include: 1. Strain of the muscles or ligaments supporting the spine. 2. Wear and tear (degeneration) of the spinal disks. 3. Arthritis. 4. Direct injury to the back. For many people, back pain may return. Since back pain is rarely dangerous, most people can learn to manage this condition on their own. HOME CARE INSTRUCTIONS Watch your back pain for any changes. The following actions may help to lessen any discomfort you are feeling: 1. Remain active. It is stressful on your back to sit or stand in one place for long periods of time. Do not sit, drive, or stand in one place for more than 30 minutes at a time. Take short walks on even surfaces as soon as you are able.Try to increase the length of time you walk each day. 2. Exercise regularly as directed by your health care provider. Exercise helps your back heal faster. It also helps avoid future injury by keeping your muscles strong and flexible. 3. Do not stay in bed.Resting more than 1-2 days can delay your recovery. 4. Pay attention to your body when you bend and lift. The most comfortable positions are those that put less stress on your recovering back. Always use proper lifting techniques, including: 1. Bending your knees. 2. Keeping the load close to your body. 3. Avoiding twisting. 5. Find a comfortable position to sleep. Use a firm mattress and lie on your side with your knees slightly bent. If you lie on your back, put a pillow under your knees. 6. Avoid feeling anxious or stressed.Stress increases muscle tension and can worsen back pain.It is important to recognize when you are anxious or stressed and learn ways to manage it, such as with exercise. 7. Take medicines only as directed by your health care provider.  Over-the-counter medicines to reduce pain and inflammation are often the most helpful.Your health care provider may prescribe muscle relaxant drugs.These medicines help dull your pain so you can more quickly return to your normal activities and healthy exercise. 8. Apply ice to the injured area: 1. Put ice in a plastic bag. 2. Place a towel between your skin and the bag. 3. Leave the ice on for 20 minutes, 2-3 times a day for the first 2-3 days. After that, ice and heat may be alternated to reduce pain and spasms. 9. Maintain a healthy weight. Excess weight puts extra stress on your back and makes it difficult to maintain good posture. SEEK MEDICAL CARE IF: 1. You have pain that is not relieved with rest or medicine. 2. You have increasing pain going down into the legs or buttocks. 3. You have pain that does not improve in one week. 4. You have night pain. 5. You lose weight. 6. You have a fever or chills. SEEK IMMEDIATE MEDICAL CARE IF:  1. You develop new bowel or bladder control problems. 2. You have unusual weakness or numbness in your arms or legs. 3. You develop nausea or vomiting. 4. You develop abdominal pain. 5. You feel faint.   This information is not intended to replace advice given to you by your health care provider. Make sure you discuss any questions you have with your health care provider.   Document Released: 03/05/2005 Document Revised: 03/26/2014 Document Reviewed: 07/07/2013 Elsevier Interactive Patient Education 2016 Elsevier  Inc.  Back Injury Prevention Back injuries can be very painful. They can also be difficult to heal. After having one back injury, you are more likely to injure your back again. It is important to learn how to avoid injuring or re-injuring your back. The following tips can help you to prevent a back injury. WHAT SHOULD I KNOW ABOUT PHYSICAL FITNESS? 5. Exercise for 30 minutes per day on most days of the week or as told by your doctor. Make  sure to: 1. Do aerobic exercises, such as walking, jogging, biking, or swimming. 2. Do exercises that increase balance and strength, such as tai chi and yoga. 3. Do stretching exercises. This helps with flexibility. 4. Try to develop strong belly (abdominal) muscles. Your belly muscles help to support your back. 6. Stay at a healthy weight. This helps to decrease your risk of a back injury. WHAT SHOULD I KNOW ABOUT MY DIET? 30. Talk with your doctor about your overall diet. Take supplements and vitamins only as told by your doctor. 45. Talk with your doctor about how much calcium and vitamin D you need each day. These nutrients help to prevent weakening of the bones (osteoporosis). 12. Include good sources of calcium in your diet, such as: 1. Dairy products. 2. Green leafy vegetables. 3. Products that have had calcium added to them (fortified). 13. Include good sources of vitamin D in your diet, such as: 1. Milk. 2. Foods that have had vitamin D added to them. WHAT SHOULD I KNOW ABOUT MY POSTURE? 7. Sit up straight and stand up straight. Avoid leaning forward when you sit or hunching over when you stand. 8. Choose chairs that have good low-back (lumbar) support. 9. If you work at a desk, sit close to it so you do not need to lean over. Keep your chin tucked in. Keep your neck drawn back. Keep your elbows bent so your arms look like the letter "L" (right angle). 10. Sit high and close to the steering wheel when you drive. Add a low-back support to your car seat, if needed. 11. Avoid sitting or standing in one position for very long. Take breaks to get up, stretch, and walk around at least one time every hour. Take breaks every hour if you are driving for long periods of time. 12. Sleep on your side with your knees slightly bent, or sleep on your back with a pillow under your knees. Do not lie on the front of your body to sleep. WHAT SHOULD I KNOW ABOUT LIFTING, TWISTING, AND REACHING Lifting  and Heavy Lifting 6. Avoid heavy lifting, especially lifting over and over again. If you must do heavy lifting: 1. Stretch before lifting. 2. Work slowly. 3. Rest between lifts. 4. Use a tool such as a cart or a dolly to move objects if one is available. 5. Make several small trips instead of carrying one heavy load. 6. Ask for help when you need it, especially when moving big objects. 7. Follow these steps when lifting: 1. Stand with your feet shoulder-width apart. 2. Get as close to the object as you can. Do not pick up a heavy object that is far from your body. 3. Use handles or lifting straps if they are available. 4. Bend at your knees. Squat down, but keep your heels off the floor. 5. Keep your shoulders back. Keep your chin tucked in. Keep your back straight. 6. Lift the object slowly while you tighten the muscles in your legs, belly, and butt.  Keep the object as close to the center of your body as possible. 8. Follow these steps when putting down a heavy load: 1. Stand with your feet shoulder-width apart. 2. Lower the object slowly while you tighten the muscles in your legs, belly, and butt. Keep the object as close to the center of your body as possible. 3. Keep your shoulders back. Keep your chin tucked in. Keep your back straight. 4. Bend at your knees. Squat down, but keep your heels off the floor. 5. Use handles or lifting straps if they are available. Twisting and Reaching 1. Avoid lifting heavy objects above your waist. 2. Do not twist at your waist while you are lifting or carrying a load. If you need to turn, move your feet. 3. Do not bend over without bending at your knees. 4. Avoid reaching over your head, across a table, or for an object on a high surface.  WHAT ARE SOME OTHER TIPS? 1. Avoid wet floors and icy ground. Keep sidewalks clear of ice to prevent falls.  2. Do not sleep on a mattress that is too soft or too hard.  3. Keep items that you use often within  easy reach.  4. Put heavier objects on shelves at waist level, and put lighter objects on lower or higher shelves. 5. Find ways to lower your stress, such as: 1. Exercise. 2. Massage. 3. Relaxation techniques. 6. Talk with your doctor if you feel anxious or depressed. These conditions can make back pain worse. 7. Wear flat heel shoes with cushioned soles. 8. Avoid making quick (sudden) movements. 9. Use both shoulder straps when carrying a backpack. 10. Do not use any tobacco products, including cigarettes, chewing tobacco, or electronic cigarettes. If you need help quitting, ask your doctor.   This information is not intended to replace advice given to you by your health care provider. Make sure you discuss any questions you have with your health care provider.   Document Released: 08/22/2007 Document Revised: 07/20/2014 Document Reviewed: 03/09/2014 Elsevier Interactive Patient Education 2016 [employer].  Back Exercises If you have pain in your back, do these exercises 2-3 times each day or as told by your doctor. When the pain goes away, do the exercises once each day, but repeat the steps more times for each exercise (do more repetitions). If you do not have pain in your back, do these exercises once each day or as told by your doctor. EXERCISES Single Knee to Chest Do these steps 3-5 times in a row for each leg: 7. Lie on your back on a firm bed or the floor with your legs stretched out. 8. Bring one knee to your chest. 9. Hold your knee to your chest by grabbing your knee or thigh. 10. Pull on your knee until you feel a gentle stretch in your lower back. 11. Keep doing the stretch for 10-30 seconds. 12. Slowly let go of your leg and straighten it. Pelvic Tilt Do these steps 5-10 times in a row: 14. Lie on your back on a firm bed or the floor with your legs stretched out. Island Walk your knees so they point up to the ceiling. Your feet should be flat on the floor. 16. Tighten  your lower belly (abdomen) muscles to press your lower back against the floor. This will make your tailbone point up to the ceiling instead of pointing down to your feet or the floor. 17. Stay in this position for 5-10 seconds while you gently tighten  your muscles and breathe evenly. Cat-Cow Do these steps until your lower back bends more easily: 13. Get on your hands and knees on a firm surface. Keep your hands under your shoulders, and keep your knees under your hips. You may put padding under your knees. 14. Let your head hang down, and make your tailbone point down to the floor so your lower back is round like the back of a cat. 15. Stay in this position for 5 seconds. 16. Slowly lift your head and make your tailbone point up to the ceiling so your back hangs low (sags) like the back of a cow. 17. Stay in this position for 5 seconds. Press-Ups Do these steps 5-10 times in a row: 9. Lie on your belly (face-down) on the floor. 10. Place your hands near your head, about shoulder-width apart. 11. While you keep your back relaxed and keep your hips on the floor, slowly straighten your arms to raise the top half of your body and lift your shoulders. Do not use your back muscles. To make yourself more comfortable, you may change where you place your hands. 12. Stay in this position for 5 seconds. 13. Slowly return to lying flat on the floor. Bridges Do these steps 10 times in a row: 5. Lie on your back on a firm surface. 6. Bend your knees so they point up to the ceiling. Your feet should be flat on the floor. 7. Tighten your butt muscles and lift your butt off of the floor until your waist is almost as high as your knees. If you do not feel the muscles working in your butt and the back of your thighs, slide your feet 1-2 inches farther away from your butt. 8. Stay in this position for 3-5 seconds. 9. Slowly lower your butt to the floor, and let your butt muscles relax. If this exercise is too  easy, try doing it with your arms crossed over your chest. Belly Crunches Do these steps 5-10 times in a row: 11. Lie on your back on a firm bed or the floor with your legs stretched out. 12. Bend your knees so they point up to the ceiling. Your feet should be flat on the floor. 60. Cross your arms over your chest. 14. Tip your chin a little bit toward your chest but do not bend your neck. 75. Tighten your belly muscles and slowly raise your chest just enough to lift your shoulder blades a tiny bit off of the floor. 16. Slowly lower your chest and your head to the floor. Back Lifts Do these steps 5-10 times in a row: 1. Lie on your belly (face-down) with your arms at your sides, and rest your forehead on the floor. 2. Tighten the muscles in your legs and your butt. 3. Slowly lift your chest off of the floor while you keep your hips on the floor. Keep the back of your head in line with the curve in your back. Look at the floor while you do this. 4. Stay in this position for 3-5 seconds. 5. Slowly lower your chest and your face to the floor. GET HELP IF:  Your back pain gets a lot worse when you do an exercise.  Your back pain does not lessen 2 hours after you exercise. If you have any of these problems, stop doing the exercises. Do not do them again unless your doctor says it is okay. GET HELP RIGHT AWAY IF:  You have sudden, very bad back  pain. If this happens, stop doing the exercises. Do not do them again unless your doctor says it is okay.   This information is not intended to replace advice given to you by your health care provider. Make sure you discuss any questions you have with your health care provider.   Document Released: 04/07/2010 Document Revised: 11/24/2014 Document Reviewed: 04/29/2014 Elsevier Interactive Patient Education [employer].

## 2015-05-11 NOTE — ED Provider Notes (Signed)
CSN: [id number]     Arrival date & time 05/11/15  1328 History   First MD Initiated Contact with Patient 05/11/15 1424     Chief Complaint  Patient presents with  . Back Pain   (Consider location/radiation/quality/duration/timing/severity/associated sxs/prior Treatment) HPI Pt states taht she has had back pain for several weeks. She missed work enough for her to be dismissed from the job. States taht she was recently in the [facility] for tennis elbow. States that is better. Pain in back may be from helping remodel a house or large dogs at the vet center pulling her.  Past Medical History  Diagnosis Date  . Asthma   . Bipolar 1 disorder (HCC)   . Hepatitis C    Past Surgical History  Procedure Laterality Date  . Tubal ligation    . Cervical biopsy  w/ loop electrode excision    . Dilation and curettage of uterus     Family History  Problem Relation Age of Onset  . Breast cancer Maternal Grandmother   . Breast cancer Paternal Grandmother   . Diabetes Paternal Grandmother    Social History  Substance Use Topics  . Smoking status: Current Every Day Smoker    Types: Cigarettes    Start date: 03/19/2009  . Smokeless tobacco: Never Used  . Alcohol Use: No   OB History    Gravida Para Term Preterm AB TAB SAB Ectopic Multiple Living   Review of Systems ROS +'ve back pain  Denies: HEADACHE, NAUSEA, ABDOMINAL PAIN, CHEST PAIN, CONGESTION, DYSURIA, SHORTNESS OF BREATH   Allergies  Latex  Home Medications   Prior to Admission medications   Medication Sig Start Date End Date Taking? Authorizing Provider  albuterol (PROVENTIL HFA;VENTOLIN HFA) 108 (90 BASE) MCG/ACT inhaler Inhale into the lungs every 6 (six) hours as needed for wheezing or shortness of breath.    Historical Provider, MD  citalopram (CELEXA) 40 MG tablet Take 40 mg by mouth daily.    Historical Provider, MD  cyclobenzaprine (FLEXERIL) 10 MG tablet Take 1 tablet (10 mg total) by mouth 2 (two) times  daily as needed for muscle spasms. 05/11/15   Tharon Aquas, PA  Elbasvir-Grazoprevir (ZEPATIER) 50-100 MG TABS Take 1 tablet by mouth daily. 02/16/15   Gardiner Barefoot, MD  HYDROcodone-acetaminophen (NORCO) 5-325 MG tablet Take 1 tablet by mouth every 6 (six) hours as needed for moderate pain. 04/29/15   Charm Rings, MD  lithium 600 MG capsule Take 600 mg by mouth 3 (three) times daily with meals.  once a day    Historical Provider, MD  meloxicam (MOBIC) 15 MG tablet Take 15 mg by mouth daily.    Historical Provider, MD  predniSONE (DELTASONE) 50 MG tablet Take 1 pill daily for 4 days starting tomorrow. Patient not taking: Reported on 05/11/2015 04/29/15   Charm Rings, MD   Meds Ordered and Administered this Visit  Medications - No data to display  BP 117/82 mmHg  Pulse 92  Temp(Src) 98.1 F (36.7 C) (Oral)  Resp 16  SpO2 100%  LMP 05/05/2015 No data found.   Physical Exam  Constitutional: She appears well-developed and well-nourished.  HENT:  Head: Normocephalic and atraumatic.  Pulmonary/Chest: Effort normal.  Abdominal: Soft. She exhibits no distension. There is no tenderness. There is no rebound and no guarding.  Musculoskeletal:       Back:  Nursing note and vitals  reviewed.   ED Course  Procedures (including critical care time)  Labs Review Labs Reviewed - No data to display  Imaging Review Dg Lumbar Spine Complete  05/11/2015  CLINICAL DATA:  Low back pain for 2 months. No known injury. Initial encounter. EXAM: LUMBAR SPINE - COMPLETE 4+ VIEW COMPARISON:  None. FINDINGS: There is no evidence of lumbar spine fracture. Alignment is normal. Intervertebral disc spaces are maintained. Tubal ligation clips are noted. IMPRESSION: Normal exam. Electronically Signed   By: Drusilla Kanner M.D.   On: 05/11/2015 15:36     Visual Acuity Review  Right Eye Distance:   Left Eye Distance:   Bilateral Distance:    Right Eye Near:   Left Eye Near:    Bilateral Near:         Review of xray and treatment plan with patieint Discharged after questions and concerns were addressed  MDM   1. Bilateral low back pain, with sciatica presence unspecified     Patient is advised to continue home symptomatic treatment.  Patient is advised that if there are new or worsening symptoms or attend the emergency department, or contact primary care provider. Instructions of care provided discharged home in stable condition. Return to work/school note provided.  THIS NOTE WAS GENERATED USING A VOICE RECOGNITION SOFTWARE PROGRAM. ALL REASONABLE EFFORTS  WERE MADE TO PROOFREAD THIS DOCUMENT FOR ACCURACY.     Tharon Aquas, PA 05/11/15 [phone]

## 2015-06-02 ENCOUNTER — Encounter (HOSPITAL_BASED_OUTPATIENT_CLINIC_OR_DEPARTMENT_OTHER): Payer: Self-pay

## 2015-06-02 ENCOUNTER — Emergency Department (HOSPITAL_BASED_OUTPATIENT_CLINIC_OR_DEPARTMENT_OTHER)
Admission: EM | Admit: 2015-06-02 | Discharge: 2015-06-03 | Disposition: A | Discharge status: Home / Self Care | Payer: No Typology Code available for payment source | Attending: Emergency Medicine | Admitting: Emergency Medicine

## 2015-06-02 DIAGNOSIS — Z79899 Other long term (current) drug therapy: Secondary | ICD-10-CM | POA: Insufficient documentation

## 2015-06-02 DIAGNOSIS — B9689 Other specified bacterial agents as the cause of diseases classified elsewhere: Secondary | ICD-10-CM

## 2015-06-02 DIAGNOSIS — Z91040 Latex allergy status: Secondary | ICD-10-CM | POA: Insufficient documentation

## 2015-06-02 DIAGNOSIS — J45909 Unspecified asthma, uncomplicated: Secondary | ICD-10-CM | POA: Insufficient documentation

## 2015-06-02 DIAGNOSIS — Z3202 Encounter for pregnancy test, result negative: Secondary | ICD-10-CM | POA: Insufficient documentation

## 2015-06-02 DIAGNOSIS — F319 Bipolar disorder, unspecified: Secondary | ICD-10-CM | POA: Insufficient documentation

## 2015-06-02 DIAGNOSIS — Z791 Long term (current) use of non-steroidal anti-inflammatories (NSAID): Secondary | ICD-10-CM | POA: Insufficient documentation

## 2015-06-02 DIAGNOSIS — F17210 Nicotine dependence, cigarettes, uncomplicated: Secondary | ICD-10-CM | POA: Insufficient documentation

## 2015-06-02 DIAGNOSIS — Z8619 Personal history of other infectious and parasitic diseases: Secondary | ICD-10-CM | POA: Insufficient documentation

## 2015-06-02 DIAGNOSIS — N760 Acute vaginitis: Secondary | ICD-10-CM | POA: Insufficient documentation

## 2015-06-02 LAB — URINALYSIS, ROUTINE W REFLEX MICROSCOPIC
BILIRUBIN URINE: NEGATIVE
Glucose, UA: NEGATIVE mg/dL
HGB URINE DIPSTICK: NEGATIVE
Ketones, ur: NEGATIVE mg/dL
Leukocytes, UA: NEGATIVE
Nitrite: NEGATIVE
PROTEIN: NEGATIVE mg/dL
Specific Gravity, Urine: 1.005 (ref 1.005–1.030)
pH: 7 (ref 5.0–8.0)

## 2015-06-02 LAB — PREGNANCY, URINE: PREG TEST UR: NEGATIVE

## 2015-06-02 NOTE — ED Notes (Signed)
Mid back pain, difficult urination x 3 days-NAD-steady gait

## 2015-06-03 LAB — WET PREP, GENITAL
SPERM: NONE SEEN
TRICH WET PREP: NONE SEEN
Yeast Wet Prep HPF POC: NONE SEEN

## 2015-06-03 LAB — GC/CHLAMYDIA PROBE AMP (~~LOC~~) NOT AT ARMC
CHLAMYDIA, DNA PROBE: NEGATIVE
NEISSERIA GONORRHEA: NEGATIVE

## 2015-06-03 MED ORDER — METRONIDAZOLE 500 MG PO TABS: 500.0000 mg | ORAL_TABLET | Freq: Two times a day (BID) | ORAL | Status: DC

## 2015-06-03 MED ORDER — METHOCARBAMOL 500 MG PO TABS: 500.0000 mg | ORAL_TABLET | Freq: Two times a day (BID) | ORAL | Status: DC | PRN

## 2015-06-03 NOTE — ED Notes (Signed)
Bladder scan 831ml

## 2015-06-03 NOTE — Discharge Instructions (Signed)
Medications: Flagyl - do not drink alcohol on this medication, continue usual home medications Follow Up: Please follow up with your primary doctor or OBGYN for discussion of your diagnoses and further evaluation after today's visit; Please return to the ER for new or worsening symptoms, any additional concerns.   You were tested for gonorrhea and chlamydia today. He will be called in 2-3 days if results are positive. If you do not get a call, results were negative.

## 2015-06-03 NOTE — ED Provider Notes (Signed)
CSN: [id number]     Arrival date & time 06/02/15  1854 History   First MD Initiated Contact with Patient 06/02/15 2245     Chief Complaint  Patient presents with  . Back Pain     (Consider location/radiation/quality/duration/timing/severity/associated sxs/prior Treatment) The history is provided by the patient and medical records. No language interpreter was used.   Sherry Mcdonald is a 35 y.o. female  with a PMH of asthma, bipolar d/o, hep c who presents to the Emergency Department complaining of bilateral low back pain x 3-4 days. Hx of the same multiple times over the last year. Denies fever, saddle anesthesia, numbness/tingling, night sweats, b/b incontinence. Patient endorses additional symptom of difficulty urinating over the same time period. She states she is able to urinate, however she must strain. Denies dysuria, constipation, vaginal discharge, vaginal bleeding, vaginal pain, n/v/d.    Past Medical History  Diagnosis Date  . Asthma   . Bipolar 1 disorder (HCC)   . Hepatitis C    Past Surgical History  Procedure Laterality Date  . Tubal ligation    . Cervical biopsy  w/ loop electrode excision    . Dilation and curettage of uterus     Family History  Problem Relation Age of Onset  . Breast cancer Maternal Grandmother   . Breast cancer Paternal Grandmother   . Diabetes Paternal Grandmother    Social History  Substance Use Topics  . Smoking status: Current Every Day Smoker    Types: Cigarettes    Start date: 03/19/2009  . Smokeless tobacco: Never Used  . Alcohol Use: No   OB History    Gravida Para Term Preterm AB TAB SAB Ectopic Multiple Living   7 3 3  4  4   3      Review of Systems  Constitutional: Negative for fever and chills.  HENT: Negative for congestion.   Eyes: Negative for visual disturbance.  Respiratory: Negative for cough.   Cardiovascular: Negative for chest pain.  Gastrointestinal: Positive for abdominal pain (Suprapubic). Negative for nausea,  vomiting, diarrhea and constipation.  Genitourinary: Positive for difficulty urinating. Negative for dysuria, vaginal bleeding, vaginal discharge and vaginal pain.  Musculoskeletal: Positive for back pain.  Skin: Negative for color change.  Neurological: Negative for weakness.      Allergies  Latex  Home Medications   Prior to Admission medications   Medication Sig Start Date End Date Taking? Authorizing Provider  albuterol (PROVENTIL HFA;VENTOLIN HFA) 108 (90 BASE) MCG/ACT inhaler Inhale into the lungs every 6 (six) hours as needed for wheezing or shortness of breath.    Historical Provider, MD  citalopram (CELEXA) 40 MG tablet Take 40 mg by mouth daily.    Historical Provider, MD  cyclobenzaprine (FLEXERIL) 10 MG tablet Take 1 tablet (10 mg total) by mouth 2 (two) times daily as needed for muscle spasms. 05/11/15   Tharon AquasFrank C Patrick, PA  lithium 600 MG capsule Take 600 mg by mouth 3 (three) times daily with meals. 300mg  once a day    Historical Provider, MD  meloxicam (MOBIC) 15 MG tablet Take 15 mg by mouth daily.    Historical Provider, MD  metroNIDAZOLE (FLAGYL) 500 MG tablet Take 1 tablet (500 mg total) by mouth 2 (two) times daily. 06/03/15   Chesley Veasey Pilcher Kyrstal Monterrosa, PA-C   BP 137/91 mmHg  Pulse 78  Temp(Src) 97.6 F (36.4 C) (Oral)  Resp 16  Ht 5\' 10"  (1.778 m)  Wt 104.327 kg  BMI 33.00 kg/m2  SpO2 100%  LMP 05/18/2015 Physical Exam  Constitutional: She is oriented to person, place, and time. She appears well-developed and well-nourished.  Alert and in no acute distress  HENT:  Head: Normocephalic and atraumatic.  Cardiovascular: Normal rate, regular rhythm and normal heart sounds.  Exam reveals no gallop and no friction rub.   No murmur heard. Pulmonary/Chest: Effort normal and breath sounds normal. No respiratory distress. She has no wheezes. She has no rales. She exhibits no tenderness.  Abdominal: Soft. Bowel sounds are normal. She exhibits no distension and no mass. There  is tenderness (Suprapubic). There is no rebound and no guarding.  Genitourinary: There is no rash, tenderness or lesion on the right labia. There is no rash, tenderness or lesion on the left labia. Cervix exhibits motion tenderness and discharge (White). Right adnexum displays no mass, no tenderness and no fullness. Left adnexum displays no mass, no tenderness and no fullness.  Musculoskeletal:       Arms: Full ROM. No midline tenderness. No overlying skin changes. Straight leg raises negative bilaterally for radicular symptoms.   Neurological: She is alert and oriented to person, place, and time. She has normal reflexes. No cranial nerve deficit.  Bilateral lower extremities neurovascularly intact.   Nursing note and vitals reviewed.   ED Course  Procedures (including critical care time) Labs Review Labs Reviewed  WET PREP, GENITAL - Abnormal; Notable for the following:    Clue Cells Wet Prep HPF POC PRESENT (*)    WBC, Wet Prep HPF POC MANY (*)    All other components within normal limits  URINALYSIS, ROUTINE W REFLEX MICROSCOPIC (NOT AT [facility])  PREGNANCY, URINE  GC/CHLAMYDIA PROBE AMP ([place]) NOT AT [facility]    Imaging Review No results found. I have personally reviewed and evaluated these images and lab results as part of my medical decision-making.   EKG Interpretation None      MDM   Final diagnoses:  BV (bacterial vaginosis)   Sherry Jo presents for two complaints:  1. Bilateral low back pain x 2-3 c/w similar episodes of back pain in the past. No red flag sxs of low back pain. Normal neuro exam. No loss of bowel or bladder control. No concern for cauda equina. No fever, night sweats, weight loss, h/o cancer. Lower extremities are neurovascularly intact and patient is ambulating without difficulty. Will treat with robaxin. Home care instruction, return precautions given.   2. Difficulty urinating x 2-3 days. UA was obtained which was unremarkable. Upreg negative.  Pelvic exam done with white cervical discharge and CMT appreciated. Wet prep shows + clue cells and many WBC's. Will treat with flagyl. G&C ordered, given CMT, I recommended prophylactic G&C treatment, however patient declined. Offered HIV, RPR but patient declined. Encouraged PCP or OBGYN follow up.   All questions answered.    Chase Picket Quintyn Dombek, PA-C 06/03/15 0101  Cy Blamer, MD 06/03/15 [phone]

## 2015-09-14 ENCOUNTER — Ambulatory Visit: Payer: No Typology Code available for payment source | Admitting: Internal Medicine

## 2016-12-25 IMAGING — CR DG ELBOW COMPLETE 3+V*R*
4 series · 4 of 4 positions shown · non-contrast
Comparison: None.

CLINICAL DATA: Elbow pain for 1 and half weeks.

EXAM:
RIGHT ELBOW - COMPLETE 3+ VIEW

[x elbow joint ap right]
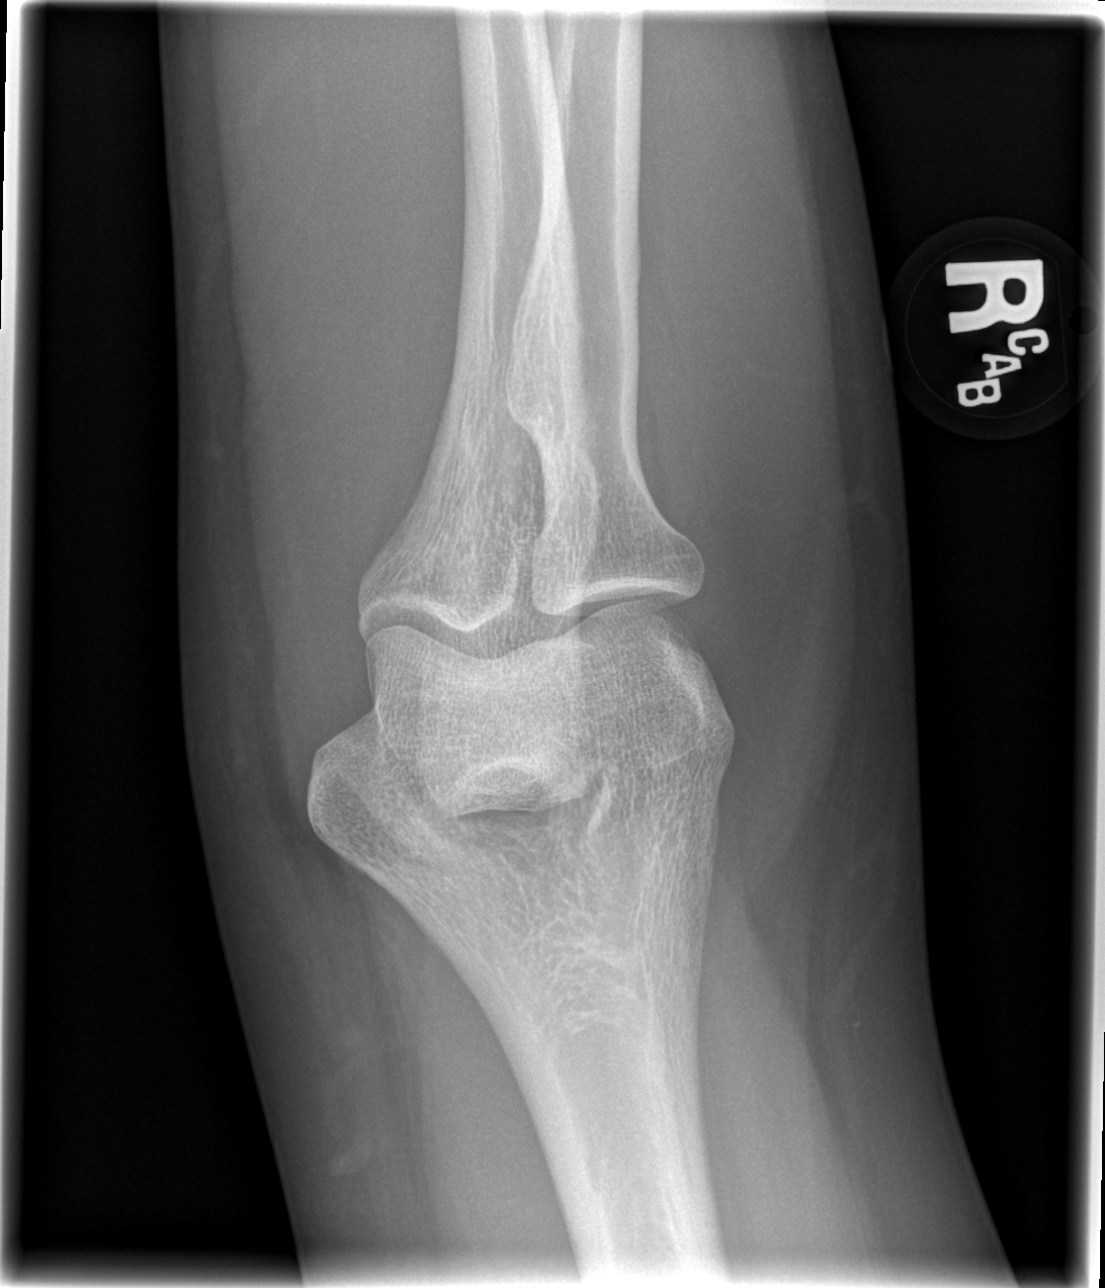

[x elbow joint obl. right (1 of 2)]
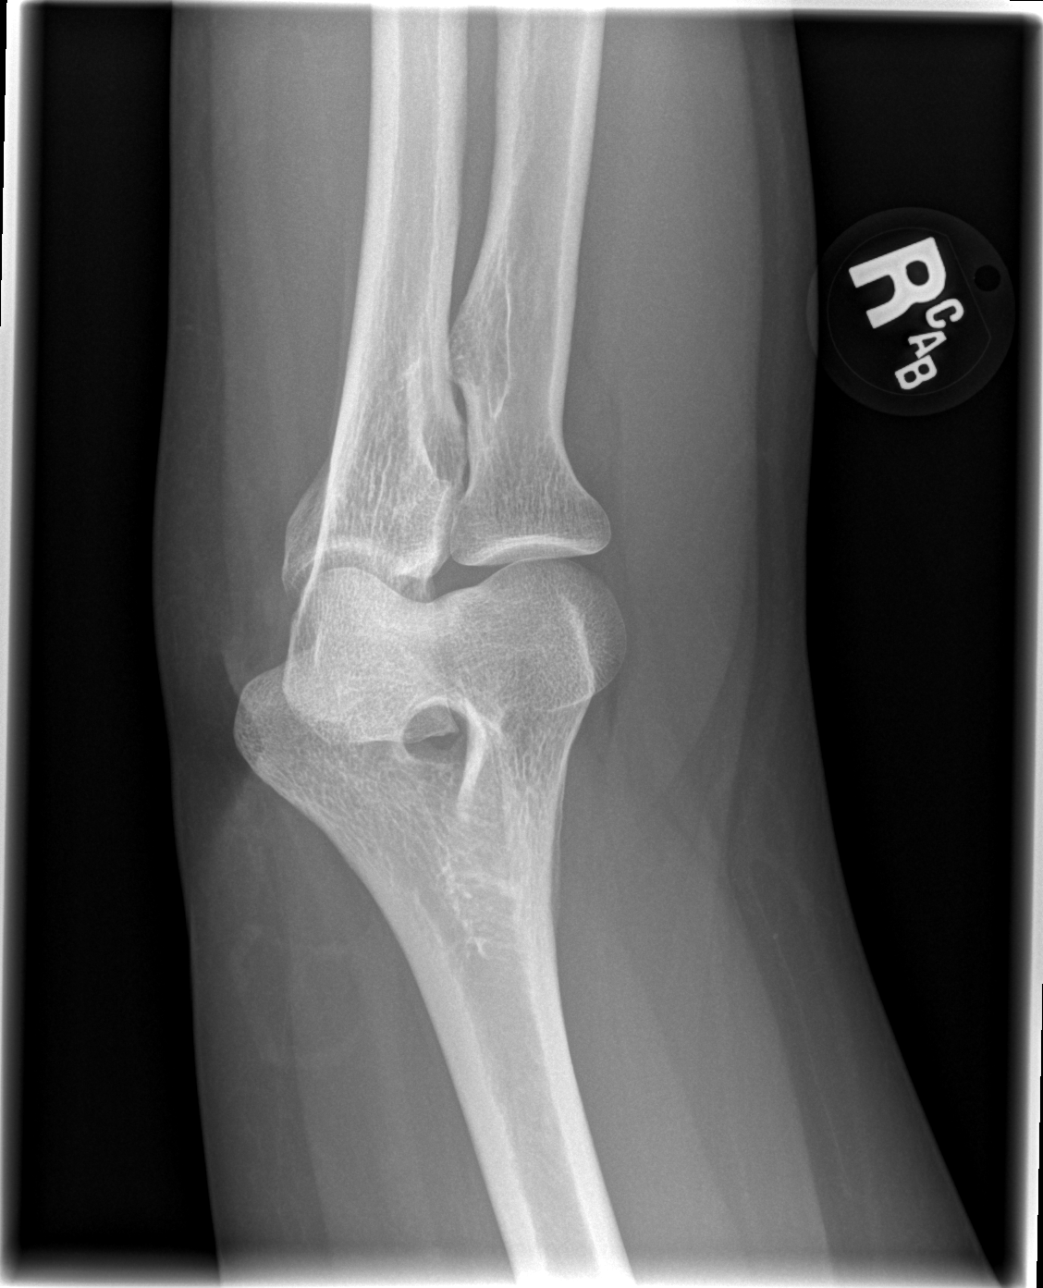

[x elbow joint obl. right (2 of 2)]
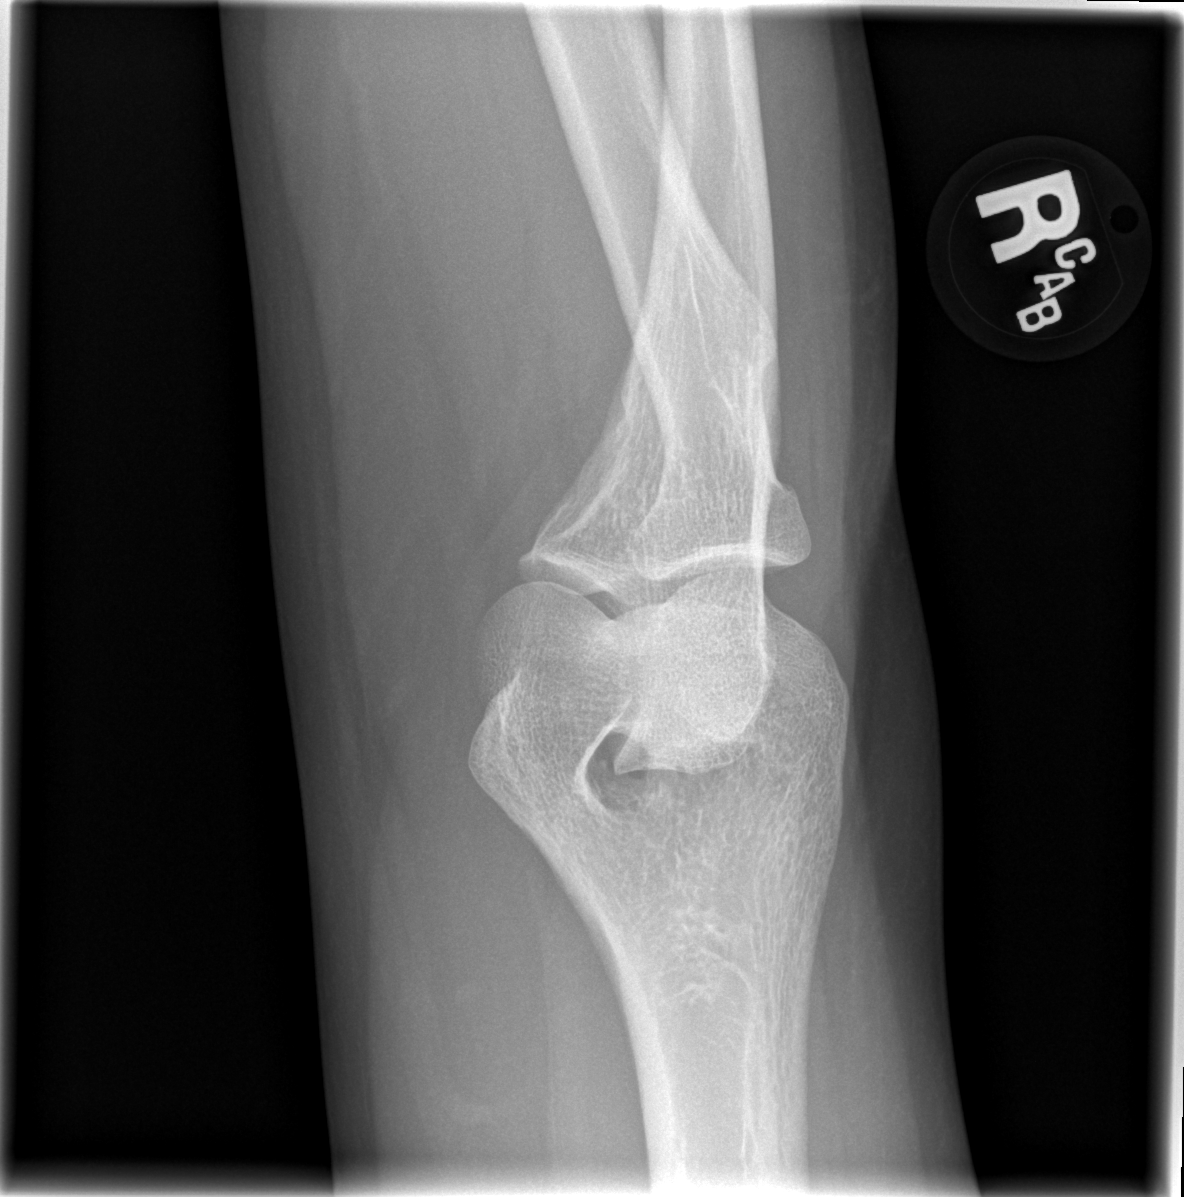

[x elbow joint lat right]
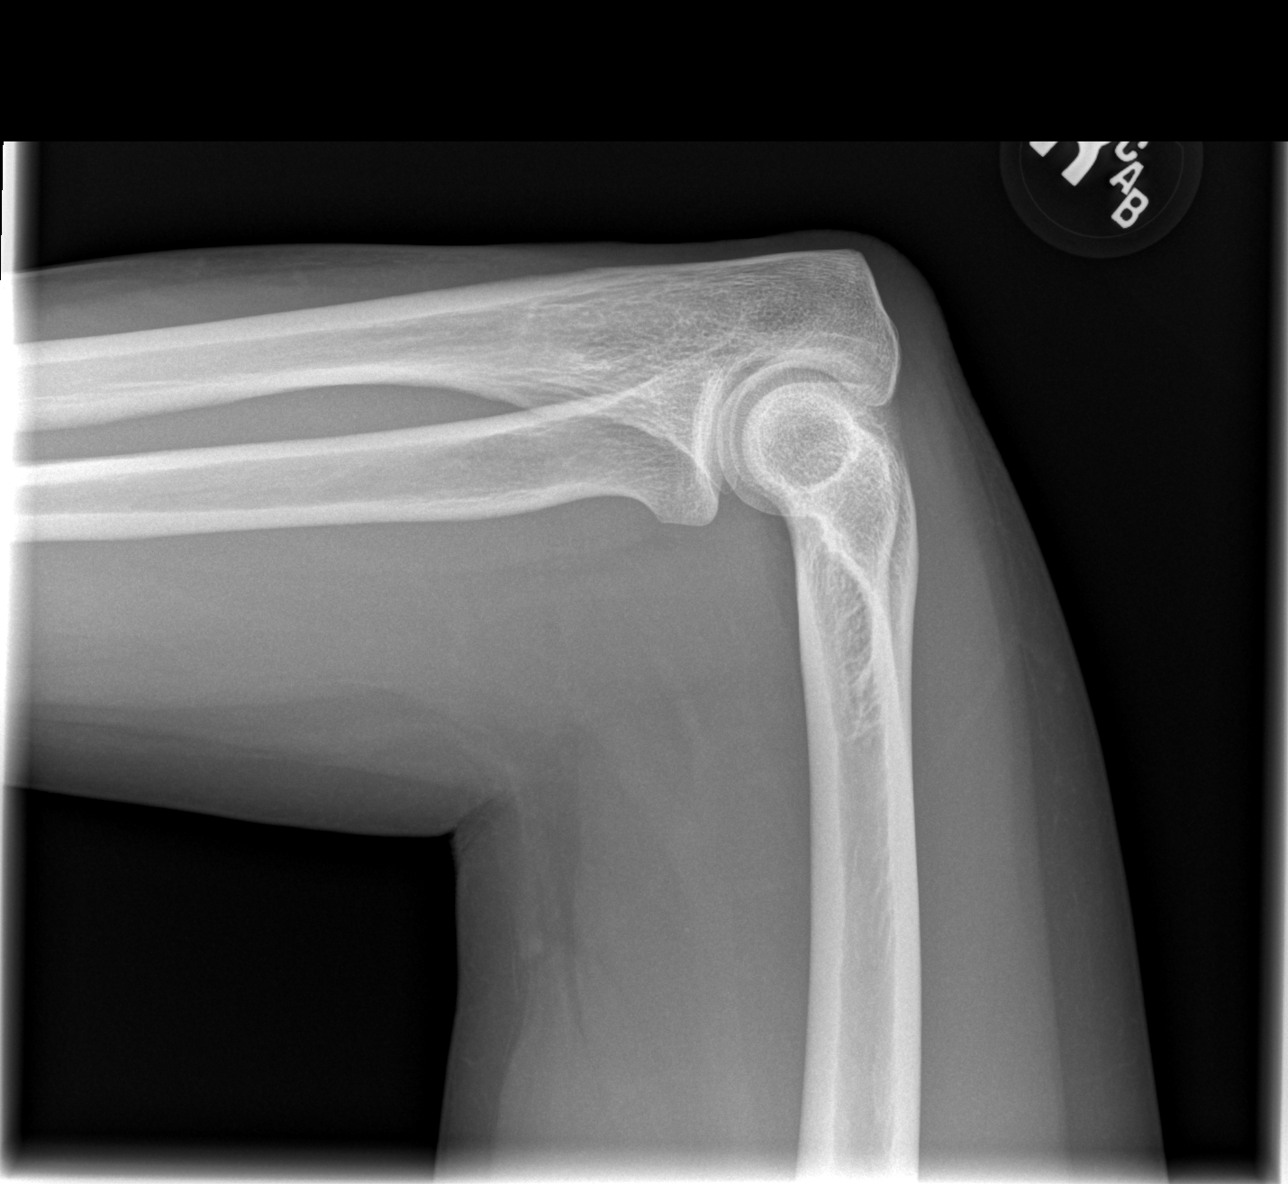

[4 of 4 positions shown; findings below may reference images not displayed]

FINDINGS: There is no evidence of fracture, dislocation, or joint effusion.
There is no evidence of arthropathy or other focal bone abnormality.
Soft tissues are unremarkable.
IMPRESSION: Negative.

## 2017-02-08 IMAGING — DX DG LUMBAR SPINE COMPLETE 4+V
5 series · 5 of 5 positions shown · non-contrast
Comparison: None.

CLINICAL DATA: Low back pain for 2 months. No known injury. Initial
encounter.

EXAM:
LUMBAR SPINE - COMPLETE 4+ VIEW

[l-spine ap]
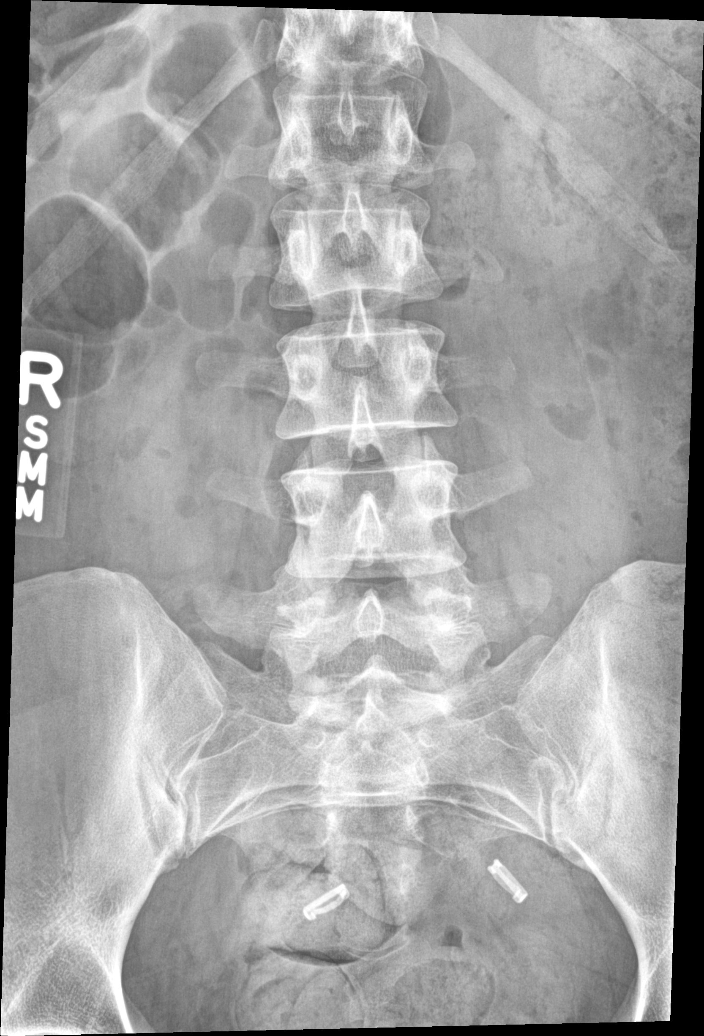

[l-spine obl (1 of 2)]
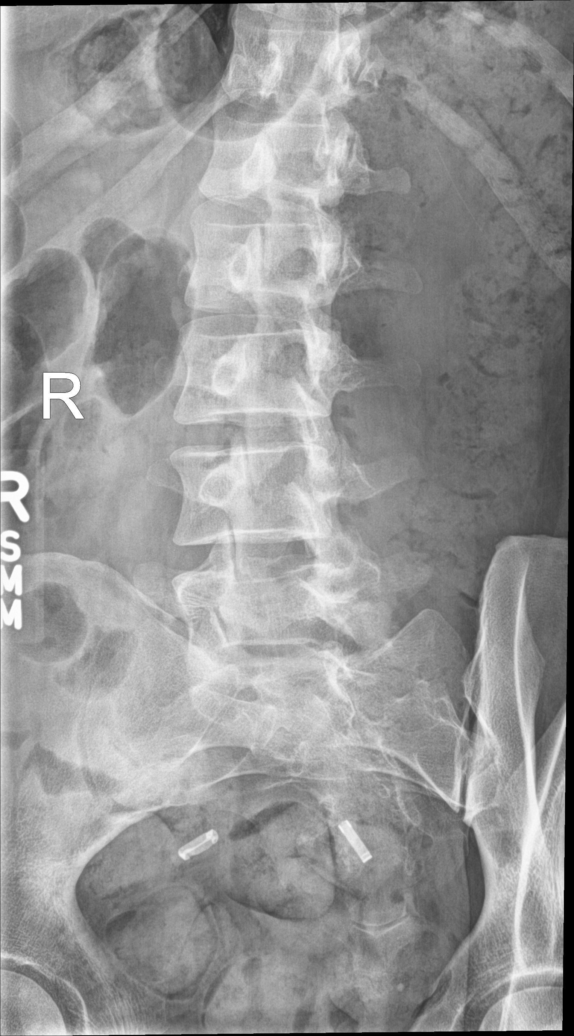

[l-spine obl (2 of 2)]
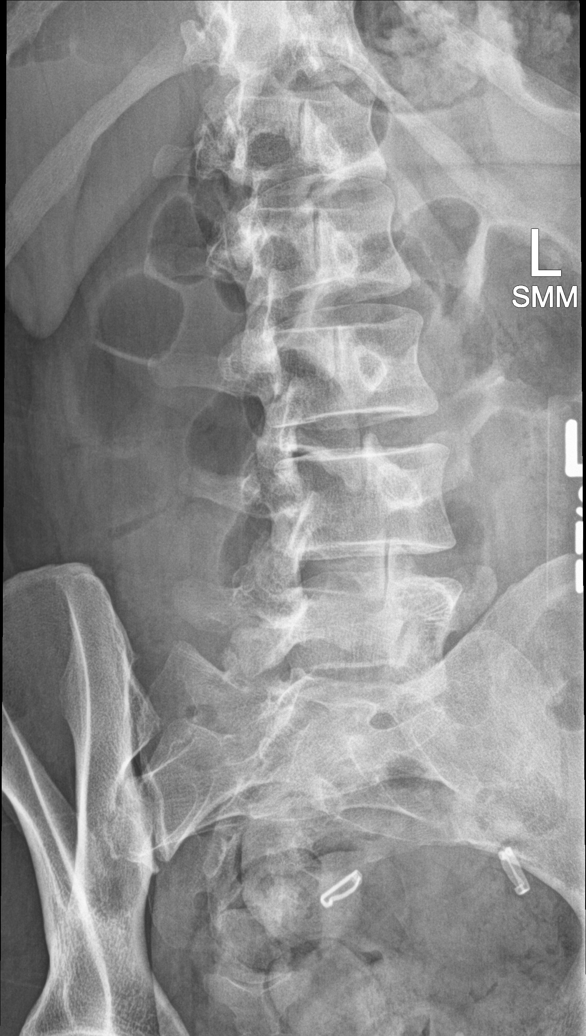

[l-spine lat]
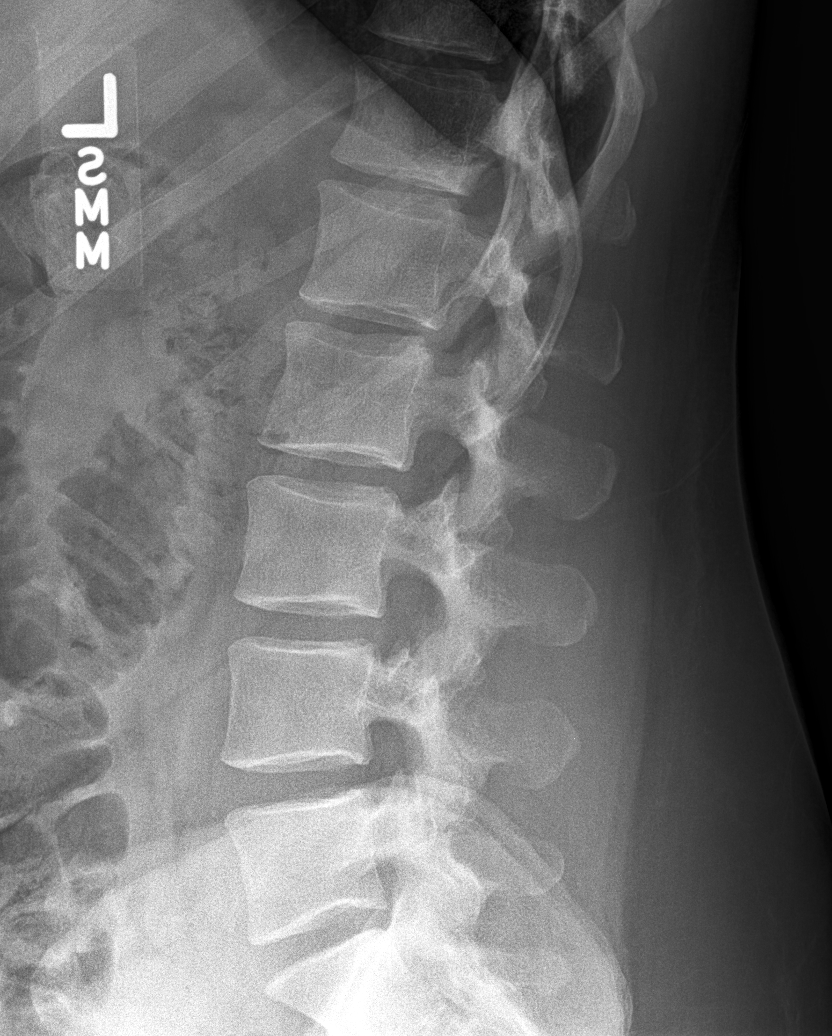

[l-spine spot]
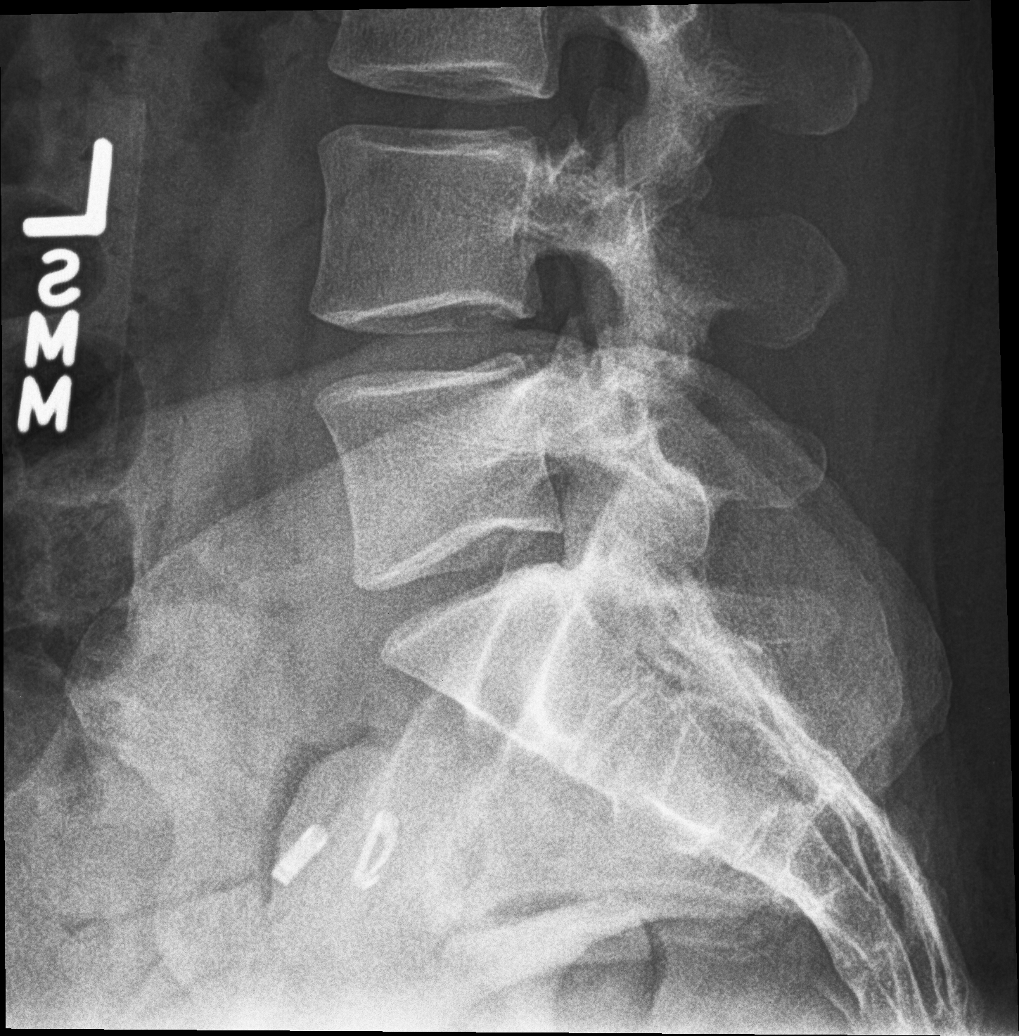

[5 of 5 positions shown; findings below may reference images not displayed]

FINDINGS: There is no evidence of lumbar spine fracture. Alignment is normal.
Intervertebral disc spaces are maintained. Tubal ligation clips are
noted.
IMPRESSION: Normal exam.

## 2021-11-21 ENCOUNTER — Encounter: Payer: Self-pay | Admitting: Internal Medicine

## 2021-11-21 ENCOUNTER — Telehealth: Payer: Self-pay

## 2021-11-21 ENCOUNTER — Ambulatory Visit (INDEPENDENT_AMBULATORY_CARE_PROVIDER_SITE_OTHER): Admitting: Internal Medicine

## 2021-11-21 ENCOUNTER — Other Ambulatory Visit: Payer: Self-pay

## 2021-11-21 VITALS — BP 110/78 | HR 91 | Temp 98.3°F | Ht 70.0 in | Wt 190.0 lb

## 2021-11-21 DIAGNOSIS — B182 Chronic viral hepatitis C: Secondary | ICD-10-CM

## 2021-11-21 DIAGNOSIS — Z79899 Other long term (current) drug therapy: Secondary | ICD-10-CM | POA: Insufficient documentation

## 2021-11-21 DIAGNOSIS — Z113 Encounter for screening for infections with a predominantly sexual mode of transmission: Secondary | ICD-10-CM

## 2021-11-21 DIAGNOSIS — B20 Human immunodeficiency virus [HIV] disease: Secondary | ICD-10-CM

## 2021-11-21 NOTE — Telephone Encounter (Signed)
Called [facility] Dept [phone]) and spoke with Sherry Dodge, RN. Reviewed Consulting Physician's report sent back after patient's office visit today.  Per written order from Dr. Luciana Mcdonald, patient to continue Biktarvy. Labs were drawn today at [facility], and Dr. Luciana Mcdonald plans to treat the patient for hepatitis C if still positive.  Sherry Mcdonald verbalized understanding. No additional questions.  Sherry Lenz, RN

## 2021-11-21 NOTE — Progress Notes (Signed)
Patient ID: Sherry Mcdonald, female   DOB: November 29, 1980, 41 y.o.   MRN: [id number]    [facility]      Reason for Consult: HIV and chronic hepatitis C    Referring Physician: [facility]    Patient ID: Sherry Mcdonald, female    DOB: 07-Aug-1980, 41 y.o.   MRN: [id number]  HPI:   She is here for evaluation and management of HIV and chronic hepatitis C. She has a history of IVDU and I saw her in 2016 for consideration for treatment for hepatitis C, genotype 1a and prescribed her Zepatier, which she started for a few doses but felt it interacted with her bipolar medications.   She was lost to follow up until now and is sent in by the [facility] jail for evaluation of HIV.  She was diagnosed with HIV about 2 years ago when entering jail and started on Biktarvy.  She has had no issues with biktarvy and continues to take it.  No recent labs available to me.  She has not had other treatment for hepatitis C previously.    Past Medical History:  Diagnosis Date   Asthma    Bipolar 1 disorder (HCC)    Hepatitis C     Prior to Admission medications   Medication Sig Start Date End Date Taking? Authorizing Provider  atorvastatin (LIPITOR) 20 MG tablet Take 20 mg by mouth daily. 10/23/21 01/21/22 Yes [provider]  bictegravir-emtricitabine-tenofovir AF (BIKTARVY) 50-200-25 MG TABS tablet Take 1 tablet by mouth daily.   Yes [provider]  calcium carbonate (OSCAL) 1500 (600 Ca) MG TABS tablet Take 1 tablet by mouth daily with breakfast. 10/24/21 01/22/22 Yes [provider]  FLUoxetine (PROZAC) 20 MG tablet Take 20 mg by mouth daily. 11/03/21 02/01/22 Yes [provider]  folic acid (FOLVITE) 1 MG tablet Take 1 mg by mouth daily. 10/17/21 01/15/22 Yes [provider]  prazosin (MINIPRESS) 2 MG capsule Take 2 mg by mouth at bedtime. 11/02/21 01/31/22 Yes [provider]  thiamine (VITAMIN B-1) 100 MG tablet Take 100 mg by mouth daily. 10/17/21  01/15/22 Yes [provider]  acetaminophen (TYLENOL) 325 MG tablet Take 650 mg by mouth 2 (two) times daily. Patient not taking: Reported on 11/21/2021 11/13/21   [provider]  albuterol (PROVENTIL HFA;VENTOLIN HFA) 108 (90 BASE) MCG/ACT inhaler Inhale into the lungs every 6 (six) hours as needed for wheezing or shortness of breath. Patient not taking: Reported on 11/21/2021    [provider]  citalopram (CELEXA) 40 MG tablet Take 40 mg by mouth daily. Patient not taking: Reported on 11/21/2021    [provider]  cyclobenzaprine (FLEXERIL) 10 MG tablet Take 1 tablet (10 mg total) by mouth 2 (two) times daily as needed for muscle spasms. Patient not taking: Reported on 11/21/2021 05/11/15   Tharon Aquas, PA  lithium 600 MG capsule Take 600 mg by mouth 3 (three) times daily with meals. 300mg  once a day Patient not taking: Reported on 11/21/2021    [provider]  meloxicam (MOBIC) 15 MG tablet Take 15 mg by mouth daily. Patient not taking: Reported on 11/21/2021    [provider]  methocarbamol (ROBAXIN) 500 MG tablet Take 1 tablet (500 mg total) by mouth 2 (two) times daily as needed for muscle spasms. Patient not taking: Reported on 11/21/2021 06/03/15   Ward, 06/05/15, PA-C  metroNIDAZOLE (FLAGYL) 500 MG tablet Take 1 tablet (500 mg total) by  mouth 2 (two) times daily. Patient not taking: Reported on 11/21/2021 06/03/15   Ward, Chase Picket, PA-C    Allergies  Allergen Reactions   Latex     Social History   Tobacco Use   Smoking status: Former    Types: Cigarettes    Start date: 03/19/2009   Smokeless tobacco: Never  Substance Use Topics   Alcohol use: No    Alcohol/week: 0.0 standard drinks of alcohol   Drug use: No    Family History  Problem Relation Age of Onset   Breast cancer Maternal Grandmother    Breast cancer Paternal Grandmother    Diabetes Paternal Grandmother     Review of Systems  Constitutional: negative for  fatigue and malaise All other systems reviewed and are negative    Constitutional: in no apparent distress  Vitals:   11/21/21 0959  BP: 110/78  Pulse: 91  Temp: 98.3 F (36.8 C)  SpO2: 96%   EYES: anicteric ENMT: no thrush Respiratory: normal respiratory effort Musculoskeletal: no edema Skin: no rash  Labs: Lab Results  Component Value Date   WBC 8.1 11/02/2009   HGB 14.2 11/02/2009   HCT 41.1 11/02/2009   MCV 88.2 11/02/2009   PLT 280 11/02/2009    Lab Results  Component Value Date   CREATININE 0.67 11/02/2009   BUN 6 11/02/2009   NA 138 11/02/2009   K 3.6 11/02/2009   CL 107 11/02/2009   CO2 24 11/02/2009    Lab Results  Component Value Date   ALT 26 11/04/2009   AST 23 11/04/2009   ALKPHOS 87 11/04/2009   BILITOT 0.4 11/04/2009   INR 0.89 01/31/2015     Assessment: HIV.  She has been on appropriate medication with Biktarvy and tolerating it well.  She is covered under [facility] and has renewed it.  I will check her labs today and have her continue with Biktarvy with no changes.   Chronic hepatitis C.  No recent labs but I suspect she will still be positive.  Will check and she will be able to get treatment from [facility]. Will prescribe after labs return.   Plan: 1)  labs today 2) continue Biiktarvy Follow up in about 3 months.

## 2021-11-22 LAB — T-HELPER CELL (CD4) - (RCID CLINIC ONLY)
CD4 % Helper T Cell: 42 % (ref 33–65)
CD4 T Cell Abs: 709 /uL (ref 400–1790)

## 2021-11-28 ENCOUNTER — Telehealth: Payer: Self-pay

## 2021-11-28 LAB — LIVER FIBROSIS, FIBROTEST-ACTITEST
ALT: 57 U/L — ABNORMAL HIGH (ref 6–29)
Alpha-2-Macroglobulin: 455 mg/dL — ABNORMAL HIGH (ref 106–279)
Apolipoprotein A1: 247 mg/dL — ABNORMAL HIGH (ref 101–198)
Bilirubin: 0.4 mg/dL (ref 0.2–1.2)
Fibrosis Score: 0.24
GGT: 53 U/L (ref 3–55)
Haptoglobin: 109 mg/dL (ref 43–212)
Necroinflammat ACT Score: 0.32
Reference ID: 4541172

## 2021-11-28 LAB — COMPLETE METABOLIC PANEL WITH GFR
AG Ratio: 0.8 (calc) — ABNORMAL LOW (ref 1.0–2.5)
ALT: 58 U/L — ABNORMAL HIGH (ref 6–29)
AST: 47 U/L — ABNORMAL HIGH (ref 10–30)
Albumin: 3.7 g/dL (ref 3.6–5.1)
Alkaline phosphatase (APISO): 72 U/L (ref 31–125)
BUN: 11 mg/dL (ref 7–25)
CO2: 24 mmol/L (ref 20–32)
Calcium: 9.3 mg/dL (ref 8.6–10.2)
Chloride: 104 mmol/L (ref 98–110)
Creat: 0.77 mg/dL (ref 0.50–0.99)
Globulin: 4.4 g/dL (calc) — ABNORMAL HIGH (ref 1.9–3.7)
Glucose, Bld: 83 mg/dL (ref 65–99)
Potassium: 3.9 mmol/L (ref 3.5–5.3)
Sodium: 137 mmol/L (ref 135–146)
Total Bilirubin: 0.4 mg/dL (ref 0.2–1.2)
Total Protein: 8.1 g/dL (ref 6.1–8.1)
eGFR: 99 mL/min/{1.73_m2} (ref >=60)

## 2021-11-28 LAB — CBC WITH DIFFERENTIAL/PLATELET
Absolute Monocytes: 632 cells/uL (ref 200–950)
Basophils Absolute: 20 cells/uL (ref 0–200)
Basophils Relative: 0.4 %
Eosinophils Absolute: 69 cells/uL (ref 15–500)
Eosinophils Relative: 1.4 %
HCT: 43.5 % (ref 35.0–45.0)
Hemoglobin: 14.9 g/dL (ref 11.7–15.5)
Lymphs Abs: 1769 cells/uL (ref 850–3900)
MCH: 31 pg (ref 27.0–33.0)
MCHC: 34.3 g/dL (ref 32.0–36.0)
MCV: 90.4 fL (ref 80.0–100.0)
MPV: 11.2 fL (ref 7.5–12.5)
Monocytes Relative: 12.9 %
Neutro Abs: 2411 cells/uL (ref 1500–7800)
Neutrophils Relative %: 49.2 %
Platelets: 262 10*3/uL (ref 140–400)
RBC: 4.81 10*6/uL (ref 3.80–5.10)
RDW: 11.8 % (ref 11.0–15.0)
Total Lymphocyte: 36.1 %
WBC: 4.9 10*3/uL (ref 3.8–10.8)

## 2021-11-28 LAB — LIPID PANEL
Cholesterol: 157 mg/dL (ref <200)
HDL: 83 mg/dL (ref >=50)
LDL Cholesterol (Calc): 46 mg/dL (calc)
Non-HDL Cholesterol (Calc): 74 mg/dL (calc) (ref <130)
Total CHOL/HDL Ratio: 1.9 (calc) (ref <5.0)
Triglycerides: 224 mg/dL — ABNORMAL HIGH (ref <150)

## 2021-11-28 LAB — HCV RNA,QN PCR RFLX GENO, LIPABAD
HCV RNA, PCR, QN (Log): 6.29 log IU/mL — ABNORMAL HIGH
HCV RNA, PCR, QN: 1940000 IU/mL — ABNORMAL HIGH

## 2021-11-28 LAB — HIV-1 RNA QUANT-NO REFLEX-BLD
HIV 1 RNA Quant: <20 Copies/mL — ABNORMAL HIGH
HIV-1 RNA Quant, Log: <1.3 Log cps/mL — ABNORMAL HIGH

## 2021-11-28 LAB — HEPATITIS B CORE ANTIBODY, TOTAL: Hep B Core Total Ab: NONREACTIVE

## 2021-11-28 LAB — HEPATITIS A ANTIBODY, TOTAL: Hepatitis A AB,Total: REACTIVE — AB

## 2021-11-28 LAB — HEPATITIS C GENOTYPE

## 2021-11-28 LAB — HEPATITIS B SURFACE ANTIGEN: Hepatitis B Surface Ag: NONREACTIVE

## 2021-11-28 LAB — HEPATITIS B SURFACE ANTIBODY,QUALITATIVE: Hep B S Ab: REACTIVE — AB

## 2021-11-28 LAB — RPR: RPR Ser Ql: NONREACTIVE

## 2021-11-28 NOTE — Telephone Encounter (Signed)
Received staff message from Dr. Luciana Axe:    "I would like to get her treated for her hepatitis C.  She can use either Harvoni or Epclusa or whatever [facility] has.  She is in [facility] jail.    Thanks"  Per Cassie, will wait to schedule for HCV treatment until patient is released. [facility] jail was unable to provide a projected release date.   Sandie Ano, RN

## 2021-12-04 ENCOUNTER — Ambulatory Visit: Admitting: Pharmacist

## 2021-12-11 ENCOUNTER — Other Ambulatory Visit: Payer: Self-pay

## 2021-12-11 ENCOUNTER — Telehealth: Payer: Self-pay

## 2021-12-11 MED ORDER — BIKTARVY 50-200-25 MG PO TABS: 1.0000 | ORAL_TABLET | Freq: Every day | ORAL | 5 refills | Status: AC

## 2021-12-11 NOTE — Telephone Encounter (Signed)
Received call today from [place] with [facility] regarding patient medication. States that pharmacy does not have any refills on file for her [place]. Per last note patient was supposed to continue with this medication and follow up in March.  Refills sent to [facility] in [place]. Will call back and schedule appointment closer to March.  Leatrice Jewels, RMA

## 2021-12-19 ENCOUNTER — Encounter: Payer: Self-pay | Admitting: Internal Medicine

## 2022-03-21 ENCOUNTER — Ambulatory Visit (INDEPENDENT_AMBULATORY_CARE_PROVIDER_SITE_OTHER): Admitting: Internal Medicine

## 2022-03-21 ENCOUNTER — Encounter: Payer: Self-pay | Admitting: Internal Medicine

## 2022-03-21 ENCOUNTER — Telehealth: Payer: Self-pay

## 2022-03-21 ENCOUNTER — Other Ambulatory Visit: Payer: Self-pay

## 2022-03-21 VITALS — BP 115/88 | HR 104 | Temp 98.0°F | Ht 70.0 in | Wt 200.0 lb

## 2022-03-21 DIAGNOSIS — B20 Human immunodeficiency virus [HIV] disease: Secondary | ICD-10-CM | POA: Diagnosis not present

## 2022-03-21 DIAGNOSIS — B182 Chronic viral hepatitis C: Secondary | ICD-10-CM

## 2022-03-21 DIAGNOSIS — Z79899 Other long term (current) drug therapy: Secondary | ICD-10-CM

## 2022-03-21 MED ORDER — LEDIPASVIR-SOFOSBUVIR 90-400 MG PO TABS: 1.0000 | ORAL_TABLET | Freq: Every day | ORAL | 2 refills | Status: AC

## 2022-03-21 NOTE — Telephone Encounter (Signed)
[place] Dept [phone]) to communicate orders on Consulting Physician's report from office visit this morning. No answer. Left message requesting a call back.  Binnie Kand, RN

## 2022-03-21 NOTE — Assessment & Plan Note (Signed)
All her previous labs were reviewed with her and reassuring.   Continue with Biktarvy and follow up in 3 months for routine care

## 2022-03-21 NOTE — Telephone Encounter (Signed)
Second attempt made to reach [facility] No answer. Did not leave another message; left VM with prior attempt.  Binnie Kand, RN

## 2022-03-21 NOTE — Assessment & Plan Note (Signed)
Hepatitis labs reviewed with her and she is positive with genotype 1a. I will have her start Harvoni 1 tab daily for 8 weeks via [facility] Side effects reviewed with her including fatigue and headaches Follow up in 3 months for follow up labs

## 2022-03-21 NOTE — Progress Notes (Signed)
   Subjective:    Patient ID: Sherry Mcdonald, female    DOB: 1980-09-27, 42 y.o.   MRN: [id number]  HPI Sherry Mcdonald is here for follow up of HIV and chronic hepatitis C She has been on Biktarvy and no issues taking it.  Labs last done in September and undetected virus.  [facility] in [facility].  No complaints today.     Review of Systems  Constitutional:  Negative for fatigue.  Gastrointestinal:  Negative for diarrhea.  Skin:  Negative for rash.       Objective:   Physical Exam Eyes:     General: No scleral icterus. Pulmonary:     Effort: Pulmonary effort is normal.  Neurological:     Mental Status: She is alert.   SH: no current tobacco        Assessment & Plan:

## 2022-03-21 NOTE — Assessment & Plan Note (Signed)
She is over 6 so would benefit with a discuss regarding statin treatment but will hold off for now pending hepatitis C treatment completion.

## 2022-06-19 ENCOUNTER — Ambulatory Visit: Payer: BC Managed Care – PPO | Admitting: Internal Medicine

## 2023-01-23 ENCOUNTER — Telehealth: Payer: Self-pay

## 2023-01-23 NOTE — Telephone Encounter (Signed)
Called [facility] to schedule overdue follow up appointment, number is not in service.   Sandie Ano, RN

## 2023-04-24 ENCOUNTER — Telehealth: Payer: Self-pay

## 2023-04-24 NOTE — Telephone Encounter (Signed)
 Patient considered out of care.  Last [facility] Visit: 03/21/22  Last HIV Viral Load:  HIV 1 RNA Quant  Date Value Ref Range Status  11/21/2021 <20 (H) Copies/mL Final    Comment:    HIV-1 RNA Detected    Last CD4 Count:  CD4 T Cell Abs  Date Value Ref Range Status  11/21/2021 709 400 - 1,790 /uL Final    Medication Dispense History:  Bictegravir-Emtricitab-Tenofov   Dispensed Days Supply Quantity Provider Pharmacy  BIKTARVY  50/200/25MG  TABLETS 05/24/2022 30 30 each Comer, Lamar ORN, MD [facility]  BIKTARVY  50/200/25MG  TABLETS 04/17/2022 30 30 each Comer, Lamar ORN, MD [facility]  BIKTARVY  50/200/25MG  TABLETS 03/21/2022 30 30 each Comer, Lamar ORN, MD [facility]      Interventions: Sherry Mcdonald, number is not in service. Called her grandmother, Sherry Mcdonald, no answer. Left HIPAA compliant voicemail requesting callback if she has any alternate contact info for Sherry Mcdonald. Does not appear that Sherry Mcdonald is still in custody. Attempted to reach other emergency contact, Sherry Mcdonald, number is not in service.   Sherry Mcdonald D Trell Secrist, RN

## 2023-05-10 ENCOUNTER — Telehealth: Payer: Self-pay

## 2023-05-10 NOTE — Telephone Encounter (Signed)
 Patient is incarcerated at [facility] in [place]. Projected release 11/14/31. [facility] notified.   [employer] [address] [place], [place]   [address] Email: [email or web address] Phone: [phone] Fax: [phone]   Sandie Ano, RN
